# Patient Record
Sex: Male | Born: 1975 | Hispanic: No | Marital: Married | Smoking: Never smoker
Health system: Southern US, Community
[De-identification: ages and names within clinical notes are randomized; demographics above are authoritative.]

## PROBLEM LIST (undated history)

## (undated) DIAGNOSIS — Z6829 Body mass index (BMI) 29.0-29.9, adult: Secondary | ICD-10-CM

## (undated) DIAGNOSIS — E063 Autoimmune thyroiditis: Secondary | ICD-10-CM

## (undated) DIAGNOSIS — E663 Overweight: Secondary | ICD-10-CM

## (undated) DIAGNOSIS — R600 Localized edema: Secondary | ICD-10-CM

## (undated) DIAGNOSIS — J1281 Pneumonia due to SARS-associated coronavirus: Secondary | ICD-10-CM

## (undated) DIAGNOSIS — E039 Hypothyroidism, unspecified: Secondary | ICD-10-CM

## (undated) DIAGNOSIS — J45909 Unspecified asthma, uncomplicated: Secondary | ICD-10-CM

## (undated) DIAGNOSIS — I519 Heart disease, unspecified: Secondary | ICD-10-CM

## (undated) DIAGNOSIS — B9721 SARS-associated coronavirus as the cause of diseases classified elsewhere: Secondary | ICD-10-CM

## (undated) HISTORY — PX: SINOSCOPY: SHX187

## (undated) HISTORY — DX: Body mass index (BMI) 29.0-29.9, adult: Z68.29

## (undated) HISTORY — DX: Heart disease, unspecified: I51.9

## (undated) HISTORY — DX: Overweight: E66.3

## (undated) HISTORY — DX: Sars-associated coronavirus as the cause of diseases classified elsewhere: B97.21

## (undated) HISTORY — DX: Localized edema: R60.0

## (undated) HISTORY — DX: Hypothyroidism, unspecified: E03.9

## (undated) HISTORY — DX: Pneumonia due to SARS-associated coronavirus: J12.81

## (undated) HISTORY — DX: Autoimmune thyroiditis: E06.3

## (undated) HISTORY — DX: Unspecified asthma, uncomplicated: J45.909

---

## 2013-07-25 ENCOUNTER — Encounter: Payer: Self-pay | Admitting: Family Medicine

## 2016-07-23 ENCOUNTER — Ambulatory Visit: Payer: BLUE CROSS/BLUE SHIELD | Admitting: Neurology

## 2016-10-24 ENCOUNTER — Ambulatory Visit (HOSPITAL_COMMUNITY)
Admission: RE | Admit: 2016-10-24 | Discharge: 2016-10-24 | Disposition: A | Discharge status: Home / Self Care | Payer: BLUE CROSS/BLUE SHIELD | Source: Ambulatory Visit | Attending: Family Medicine | Admitting: Family Medicine

## 2016-10-24 ENCOUNTER — Other Ambulatory Visit (HOSPITAL_COMMUNITY): Payer: Self-pay | Admitting: Family Medicine

## 2016-10-24 DIAGNOSIS — R221 Localized swelling, mass and lump, neck: Secondary | ICD-10-CM

## 2016-10-24 LAB — TSH: TSH: 7.3 u[IU]/mL — AB (ref <=5.90)

## 2016-11-05 ENCOUNTER — Encounter: Payer: Self-pay | Admitting: Internal Medicine

## 2016-11-28 ENCOUNTER — Ambulatory Visit: Payer: BLUE CROSS/BLUE SHIELD | Admitting: Gastroenterology

## 2016-12-08 ENCOUNTER — Ambulatory Visit: Payer: BLUE CROSS/BLUE SHIELD | Admitting: "Endocrinology

## 2017-03-09 ENCOUNTER — Encounter: Payer: Self-pay | Admitting: "Endocrinology

## 2017-03-09 ENCOUNTER — Ambulatory Visit (INDEPENDENT_AMBULATORY_CARE_PROVIDER_SITE_OTHER): Payer: BLUE CROSS/BLUE SHIELD | Admitting: "Endocrinology

## 2017-03-09 ENCOUNTER — Other Ambulatory Visit: Payer: Self-pay | Admitting: "Endocrinology

## 2017-03-09 VITALS — BP 138/84 | HR 70 | Ht 69.0 in | Wt 209.0 lb

## 2017-03-09 DIAGNOSIS — E039 Hypothyroidism, unspecified: Secondary | ICD-10-CM

## 2017-03-09 NOTE — Progress Notes (Signed)
Subjective:    Patient ID: Eddie Ray, male    DOB: 07/16/1976, PCP Elfredia NevinsFusco, Lawrence, MD   Past Medical History:  Diagnosis Date  . Hypothyroidism    History reviewed. No pertinent surgical history. Social History   Social History  . Marital status: Married    Spouse name: N/A  . Number of children: N/A  . Years of education: N/A   Social History Main Topics  . Smoking status: Never Smoker  . Smokeless tobacco: Current User  . Alcohol use No  . Drug use: No  . Sexual activity: Not Asked   Other Topics Concern  . None   Social History Narrative  . None   Outpatient Encounter Prescriptions as of 03/09/2017  Medication Sig  . levothyroxine (SYNTHROID, LEVOTHROID) 25 MCG tablet Take 25 mcg by mouth daily before breakfast.   No facility-administered encounter medications on file as of 03/09/2017.    ALLERGIES: No Known Allergies  VACCINATION STATUS:  There is no immunization history on file for this patient.  HPI  41 year old gentleman with medical history as above. He is being seen in consultation for hypothyroidism requested by Dr. Sherwood GamblerFusco. -He was diagnosed with hypothyroidism in December 2017 when he was started on levothyroxine 25 g by mouth every morning. He reports compliance. He continues to gain weight, gaining 20+ pounds over the last year. He complains of fatigue, constipation. He has family history of hypothyroidism in one of his sisters. He denies any personal history of goiter, nor any family history of thyroid cancer. -He denies heat/cold intolerance.  Review of Systems  Constitutional: + weight gain, + fatigue, no subjective hyperthermia, no subjective hypothermia Eyes: no blurry vision, no xerophthalmia ENT: no sore throat, no nodules palpated in throat, no dysphagia/odynophagia, no hoarseness Cardiovascular: no Chest Pain, no Shortness of Breath, no palpitations, no leg swelling Respiratory: no cough, no SOB Gastrointestinal: no  Nausea/Vomiting/Diarhhea Musculoskeletal: no muscle/joint aches Skin: no rashes Neurological: no tremors, no numbness, no tingling, no dizziness Psychiatric: no depression, no anxiety  Objective:    BP 138/84   Pulse 70   Ht 5\' 9"  (1.753 m)   Wt 209 lb (94.8 kg)   BMI 30.86 kg/m   Wt Readings from Last 3 Encounters:  03/09/17 209 lb (94.8 kg)    Physical Exam  Constitutional:  Significantly over weight for hight, not in acute distress, normal state of mind Eyes: PERRLA, EOMI, no exophthalmos ENT: moist mucous membranes, no thyromegaly, no cervical lymphadenopathy Cardiovascular: normal precordial activity, Regular Rate and Rhythm, no Murmur/Rubs/Gallops Respiratory:  adequate breathing efforts, no gross chest deformity, Clear to auscultation bilaterally Gastrointestinal: abdomen obese, soft, Non -tender, No distension, Bowel Sounds present Musculoskeletal: no gross deformities, strength intact in all four extremities Skin: moist, warm, no rashes Neurological: no tremor with outstretched hands, Deep tendon reflexes normal in all four extremities.  10/24/2016: TSH 7.3.   Assessment & Plan:   1. Hypothyroidism, unspecified type - This patient is being seen in kind request of Dr. Sherwood GamblerFusco. I have reviewed his available records and clinically evaluated this patient. He seems to have hypothyroidism which would need lifelong thyroid hormone replacement- nightly with higher dose. - His last thyroid function tests are from December 2017, not recently enough to make a dose adjustment. I will send him to lab to get new set of thyroid function test and he will return in 1 week for reevaluation. In the meantime, I advised him to continue levothyroxine 25 V by mouth every morning. - -  We discussed about correct intake of levothyroxine, at fasting, with water, separated by at least 30 minutes from breakfast, and separated by more than 4 hours from calcium, iron, multivitamins, acid reflux  medications (PPIs). -Patient is made aware of the fact that thyroid hormone replacement is needed for life, dose to be adjusted by periodic monitoring of thyroid function tests.  - I advised patient to maintain close follow up with Elfredia Nevins, MD for primary care needs. Follow up plan: Return in about 1 week (around 03/16/2017) for labs today, follow up with pre-visit labs.  Marquis Lunch, MD Phone: [phone]  Fax: [phone]   03/09/2017, 2:01 PM

## 2017-03-10 LAB — THYROID PEROXIDASE ANTIBODY: Thyroperoxidase Ab SerPl-aCnc: 282 IU/mL — ABNORMAL HIGH (ref 0–34)

## 2017-03-10 LAB — THYROGLOBULIN ANTIBODY: THYROGLOBULIN ANTIBODY: 1.1 [IU]/mL — AB (ref 0.0–0.9)

## 2017-03-10 LAB — TSH: TSH: 19.74 u[IU]/mL — ABNORMAL HIGH (ref 0.450–4.500)

## 2017-03-10 LAB — T3: T3, Total: 129 ng/dL (ref 71–180)

## 2017-03-10 LAB — T4, FREE: Free T4: 1.14 ng/dL (ref 0.82–1.77)

## 2017-03-16 ENCOUNTER — Encounter: Payer: Self-pay | Admitting: "Endocrinology

## 2017-03-16 ENCOUNTER — Ambulatory Visit (INDEPENDENT_AMBULATORY_CARE_PROVIDER_SITE_OTHER): Payer: BLUE CROSS/BLUE SHIELD | Admitting: "Endocrinology

## 2017-03-16 VITALS — BP 133/88 | HR 80 | Ht 69.0 in | Wt 210.0 lb

## 2017-03-16 DIAGNOSIS — E038 Other specified hypothyroidism: Secondary | ICD-10-CM

## 2017-03-16 DIAGNOSIS — E063 Autoimmune thyroiditis: Secondary | ICD-10-CM | POA: Diagnosis not present

## 2017-03-16 HISTORY — DX: Other specified hypothyroidism: E03.8

## 2017-03-16 HISTORY — DX: Autoimmune thyroiditis: E06.3

## 2017-03-16 MED ORDER — LEVOTHYROXINE SODIUM 50 MCG PO TABS: 50.0000 ug | ORAL_TABLET | Freq: Every day | ORAL | 2 refills | Status: DC

## 2017-03-16 NOTE — Progress Notes (Signed)
Subjective:    Patient ID: Eddie Ray, male    DOB: 04/06/1976, PCP Elfredia NevinsFusco, Lawrence, MD   Past Medical History:  Diagnosis Date  . Hypothyroidism    No past surgical history on file. Social History   Social History  . Marital status: Married    Spouse name: N/A  . Number of children: N/A  . Years of education: N/A   Social History Main Topics  . Smoking status: Never Smoker  . Smokeless tobacco: Current User  . Alcohol use No  . Drug use: No  . Sexual activity: Not on file   Other Topics Concern  . Not on file   Social History Narrative  . No narrative on file   Outpatient Encounter Prescriptions as of 03/16/2017  Medication Sig  . levothyroxine (SYNTHROID, LEVOTHROID) 50 MCG tablet Take 1 tablet (50 mcg total) by mouth daily before breakfast.  . [DISCONTINUED] levothyroxine (SYNTHROID, LEVOTHROID) 25 MCG tablet Take 25 mcg by mouth daily before breakfast.   No facility-administered encounter medications on file as of 03/16/2017.    ALLERGIES: No Known Allergies  VACCINATION STATUS:  There is no immunization history on file for this patient.  HPI  41 year old gentleman with medical history as above. He is being seen in f/u for hypothyroidism requested by Dr. Sherwood GamblerFusco. -He was diagnosed with hypothyroidism in December 2017 when he was started on levothyroxine 25 g by mouth every morning. He reports compliance. He continues to gain weight, gaining 20+ pounds over the last year. He complains of fatigue, constipation. He has family history of hypothyroidism in one of his sisters. He denies any personal history of goiter, nor any family history of thyroid cancer. -He denies heat/cold intolerance.  Review of Systems  Constitutional: + weight gain, + fatigue, no subjective hyperthermia, no subjective hypothermia Eyes: no blurry vision, no xerophthalmia ENT: no sore throat, no nodules palpated in throat, no dysphagia/odynophagia, no hoarseness Cardiovascular: no  Chest Pain, no Shortness of Breath, no palpitations, no leg swelling Respiratory: no cough, no SOB Gastrointestinal: no Nausea/Vomiting/Diarhhea Musculoskeletal: no muscle/joint aches Skin: no rashes Neurological: no tremors, no numbness, no tingling, no dizziness Psychiatric: no depression, no anxiety  Objective:    BP 133/88   Pulse 80   Ht 5\' 9"  (1.753 m)   Wt 210 lb (95.3 kg)   BMI 31.01 kg/m   Wt Readings from Last 3 Encounters:  03/16/17 210 lb (95.3 kg)  03/09/17 209 lb (94.8 kg)    Physical Exam  Constitutional:  Significantly over weight for hight, not in acute distress, normal state of mind Eyes: PERRLA, EOMI, no exophthalmos ENT: moist mucous membranes, no thyromegaly, no cervical lymphadenopathy Cardiovascular: normal precordial activity, Regular Rate and Rhythm, no Murmur/Rubs/Gallops Respiratory:  adequate breathing efforts, no gross chest deformity, Clear to auscultation bilaterally Gastrointestinal: abdomen obese, soft, Non -tender, No distension, Bowel Sounds present Musculoskeletal: no gross deformities, strength intact in all four extremities Skin: moist, warm, no rashes Neurological: no tremor with outstretched hands, Deep tendon reflexes normal in all four extremities.  10/24/2016: TSH 7.3.   Assessment & Plan:   1. Hypothyroidism. 2. Hashimoto's thyroiditis. - Based on his recent thyroid function test forms of his hypothyroidism is Hashimoto's thyroiditis and he would benefit from higher dose of levothyroxine. - I will prescribe levothyroxine 50 mg by mouth every morning.  - We discussed about correct intake of levothyroxine, at fasting, with water, separated by at least 30 minutes from breakfast, and separated by more than 4  hours from calcium, iron, multivitamins, acid reflux medications (PPIs). -Patient is made aware of the fact that thyroid hormone replacement is needed for life, dose to be adjusted by periodic monitoring of thyroid function  tests.  - I advised patient to maintain close follow up with Elfredia Nevins, MD for primary care needs. Follow up plan: Return in about 8 weeks (around 05/11/2017) for follow up with pre-visit labs.  Marquis Lunch, MD Phone: [phone]  Fax: [phone]   03/16/2017, 3:41 PM

## 2017-05-25 ENCOUNTER — Ambulatory Visit: Payer: BLUE CROSS/BLUE SHIELD | Admitting: "Endocrinology

## 2017-05-25 ENCOUNTER — Encounter: Payer: Self-pay | Admitting: "Endocrinology

## 2017-07-02 ENCOUNTER — Other Ambulatory Visit: Payer: Self-pay | Admitting: "Endocrinology

## 2017-10-18 ENCOUNTER — Other Ambulatory Visit: Payer: Self-pay | Admitting: "Endocrinology

## 2019-08-05 ENCOUNTER — Emergency Department (HOSPITAL_COMMUNITY): Payer: BC Managed Care – PPO

## 2019-08-05 ENCOUNTER — Other Ambulatory Visit: Payer: Self-pay

## 2019-08-05 ENCOUNTER — Inpatient Hospital Stay (HOSPITAL_COMMUNITY)
Admission: EM | Admit: 2019-08-05 | Discharge: 2019-08-08 | DRG: 291 | Disposition: A | Discharge status: Home / Self Care | Payer: BC Managed Care – PPO | Attending: Internal Medicine | Admitting: Internal Medicine

## 2019-08-05 ENCOUNTER — Inpatient Hospital Stay (HOSPITAL_COMMUNITY): Payer: BC Managed Care – PPO

## 2019-08-05 ENCOUNTER — Encounter (HOSPITAL_COMMUNITY): Payer: Self-pay | Admitting: Internal Medicine

## 2019-08-05 DIAGNOSIS — R0602 Shortness of breath: Secondary | ICD-10-CM | POA: Diagnosis not present

## 2019-08-05 DIAGNOSIS — J9601 Acute respiratory failure with hypoxia: Secondary | ICD-10-CM | POA: Diagnosis present

## 2019-08-05 DIAGNOSIS — J810 Acute pulmonary edema: Secondary | ICD-10-CM | POA: Diagnosis not present

## 2019-08-05 DIAGNOSIS — E038 Other specified hypothyroidism: Secondary | ICD-10-CM | POA: Diagnosis present

## 2019-08-05 DIAGNOSIS — N390 Urinary tract infection, site not specified: Secondary | ICD-10-CM | POA: Diagnosis present

## 2019-08-05 DIAGNOSIS — I5031 Acute diastolic (congestive) heart failure: Principal | ICD-10-CM | POA: Diagnosis present

## 2019-08-05 DIAGNOSIS — Z79890 Hormone replacement therapy (postmenopausal): Secondary | ICD-10-CM | POA: Diagnosis not present

## 2019-08-05 DIAGNOSIS — Z20828 Contact with and (suspected) exposure to other viral communicable diseases: Secondary | ICD-10-CM | POA: Diagnosis present

## 2019-08-05 DIAGNOSIS — E063 Autoimmune thyroiditis: Secondary | ICD-10-CM | POA: Diagnosis present

## 2019-08-05 DIAGNOSIS — R112 Nausea with vomiting, unspecified: Secondary | ICD-10-CM | POA: Diagnosis present

## 2019-08-05 DIAGNOSIS — K209 Esophagitis, unspecified without bleeding: Secondary | ICD-10-CM | POA: Diagnosis present

## 2019-08-05 DIAGNOSIS — K760 Fatty (change of) liver, not elsewhere classified: Secondary | ICD-10-CM | POA: Diagnosis present

## 2019-08-05 DIAGNOSIS — R0902 Hypoxemia: Secondary | ICD-10-CM

## 2019-08-05 DIAGNOSIS — R000 Tachycardia, unspecified: Secondary | ICD-10-CM

## 2019-08-05 HISTORY — DX: Nausea with vomiting, unspecified: R11.2

## 2019-08-05 LAB — COMPREHENSIVE METABOLIC PANEL
ALT: 32 U/L (ref 0–44)
AST: 26 U/L (ref 15–41)
Albumin: 4 g/dL (ref 3.5–5.0)
Alkaline Phosphatase: 59 U/L (ref 38–126)
Anion gap: 14 (ref 5–15)
BUN: 20 mg/dL (ref 6–20)
CO2: 21 mmol/L — ABNORMAL LOW (ref 22–32)
Calcium: 8.6 mg/dL — ABNORMAL LOW (ref 8.9–10.3)
Chloride: 105 mmol/L (ref 98–111)
Creatinine, Ser: 1.3 mg/dL — ABNORMAL HIGH (ref 0.61–1.24)
GFR calc Af Amer: >60 mL/min (ref >60)
GFR calc non Af Amer: >60 mL/min (ref >60)
Glucose, Bld: 224 mg/dL — ABNORMAL HIGH (ref 70–99)
Potassium: 3.5 mmol/L (ref 3.5–5.1)
Sodium: 140 mmol/L (ref 135–145)
Total Bilirubin: 0.6 mg/dL (ref 0.3–1.2)
Total Protein: 6.4 g/dL — ABNORMAL LOW (ref 6.5–8.1)

## 2019-08-05 LAB — CBC WITH DIFFERENTIAL/PLATELET
Abs Immature Granulocytes: 0.07 10*3/uL (ref 0.00–0.07)
Basophils Absolute: 0 10*3/uL (ref 0.0–0.1)
Basophils Relative: 0 %
Eosinophils Absolute: 0.2 10*3/uL (ref 0.0–0.5)
Eosinophils Relative: 2 %
HCT: 44.3 % (ref 39.0–52.0)
Hemoglobin: 14.5 g/dL (ref 13.0–17.0)
Immature Granulocytes: 1 %
Lymphocytes Relative: 21 %
Lymphs Abs: 2.3 10*3/uL (ref 0.7–4.0)
MCH: 32 pg (ref 26.0–34.0)
MCHC: 32.7 g/dL (ref 30.0–36.0)
MCV: 97.8 fL (ref 80.0–100.0)
Monocytes Absolute: 0.3 10*3/uL (ref 0.1–1.0)
Monocytes Relative: 2 %
Neutro Abs: 8.3 10*3/uL — ABNORMAL HIGH (ref 1.7–7.7)
Neutrophils Relative %: 74 %
Platelets: 452 10*3/uL — ABNORMAL HIGH (ref 150–400)
RBC: 4.53 MIL/uL (ref 4.22–5.81)
RDW: 12.3 % (ref 11.5–15.5)
WBC: 11.1 10*3/uL — ABNORMAL HIGH (ref 4.0–10.5)
nRBC: 0 % (ref 0.0–0.2)

## 2019-08-05 LAB — TSH: TSH: 10.279 u[IU]/mL — ABNORMAL HIGH (ref 0.350–4.500)

## 2019-08-05 LAB — C-REACTIVE PROTEIN: CRP: <0.8 mg/dL (ref <1.0)

## 2019-08-05 LAB — URINALYSIS, MICROSCOPIC (REFLEX): RBC / HPF: NONE SEEN RBC/hpf (ref 0–5)

## 2019-08-05 LAB — RESPIRATORY PANEL BY PCR

## 2019-08-05 LAB — URINALYSIS, ROUTINE W REFLEX MICROSCOPIC
Bilirubin Urine: NEGATIVE
Glucose, UA: NEGATIVE mg/dL
Hgb urine dipstick: NEGATIVE
Ketones, ur: 15 mg/dL — AB
Leukocytes,Ua: NEGATIVE
Nitrite: NEGATIVE
Protein, ur: 30 mg/dL — AB
Specific Gravity, Urine: 1.01 (ref 1.005–1.030)
pH: 5 (ref 5.0–8.0)

## 2019-08-05 LAB — TRIGLYCERIDES: Triglycerides: 103 mg/dL (ref <150)

## 2019-08-05 LAB — BRAIN NATRIURETIC PEPTIDE: B Natriuretic Peptide: 17.1 pg/mL (ref 0.0–100.0)

## 2019-08-05 LAB — LACTIC ACID, PLASMA
Lactic Acid, Venous: 1.8 mmol/L (ref 0.5–1.9)
Lactic Acid, Venous: 2.7 mmol/L (ref 0.5–1.9)
Lactic Acid, Venous: 1 mmol/L (ref 0.5–1.9)
Lactic Acid, Venous: 1.3 mmol/L (ref 0.5–1.9)

## 2019-08-05 LAB — LACTATE DEHYDROGENASE: LDH: 218 U/L — ABNORMAL HIGH (ref 98–192)

## 2019-08-05 LAB — ECHOCARDIOGRAM COMPLETE

## 2019-08-05 LAB — D-DIMER, QUANTITATIVE: D-Dimer, Quant: 3.97 ug/mL-FEU — ABNORMAL HIGH (ref 0.00–0.50)

## 2019-08-05 LAB — INFLUENZA PANEL BY PCR (TYPE A & B)
Influenza A By PCR: NEGATIVE
Influenza B By PCR: NEGATIVE

## 2019-08-05 LAB — SARS CORONAVIRUS 2 BY RT PCR (HOSPITAL ORDER, PERFORMED IN ~~LOC~~ HOSPITAL LAB): SARS Coronavirus 2: NEGATIVE

## 2019-08-05 LAB — FIBRINOGEN: Fibrinogen: 304 mg/dL (ref 210–475)

## 2019-08-05 LAB — FERRITIN: Ferritin: 37 ng/mL (ref 24–336)

## 2019-08-05 LAB — PROCALCITONIN: Procalcitonin: <0.1 ng/mL

## 2019-08-05 LAB — TROPONIN I (HIGH SENSITIVITY): Troponin I (High Sensitivity): 21 ng/L — ABNORMAL HIGH (ref <18)

## 2019-08-05 LAB — HIV ANTIBODY (ROUTINE TESTING W REFLEX): HIV Screen 4th Generation wRfx: NONREACTIVE

## 2019-08-05 MED ORDER — DOCUSATE SODIUM 100 MG PO CAPS
100.0000 mg | ORAL_CAPSULE | Freq: Two times a day (BID) | ORAL | Status: DC
  Administered 2019-08-06 – 2019-08-08 (×5): 100 mg via ORAL
  Filled 2019-08-05 (×7): qty 1

## 2019-08-05 MED ORDER — LEVOTHYROXINE SODIUM 50 MCG PO TABS
50.0000 ug | ORAL_TABLET | Freq: Every day | ORAL | Status: DC
  Administered 2019-08-05 – 2019-08-08 (×4): 50 ug via ORAL
  Filled 2019-08-05 (×5): qty 1

## 2019-08-05 MED ORDER — ACETAMINOPHEN 325 MG PO TABS: 650.0000 mg | ORAL_TABLET | Freq: Four times a day (QID) | ORAL | Status: DC | PRN

## 2019-08-05 MED ORDER — SODIUM CHLORIDE 0.9% FLUSH
3.0000 mL | Freq: Two times a day (BID) | INTRAVENOUS | Status: DC
  Administered 2019-08-05 – 2019-08-08 (×2): 3 mL via INTRAVENOUS

## 2019-08-05 MED ORDER — SODIUM CHLORIDE 0.9 % IV SOLN: 250.0000 mL | INTRAVENOUS | Status: DC | PRN

## 2019-08-05 MED ORDER — ONDANSETRON HCL 4 MG/2ML IJ SOLN
4.0000 mg | Freq: Four times a day (QID) | INTRAMUSCULAR | Status: DC | PRN
  Filled 2019-08-05: qty 2

## 2019-08-05 MED ORDER — ENOXAPARIN SODIUM 40 MG/0.4ML ~~LOC~~ SOLN
40.0000 mg | SUBCUTANEOUS | Status: DC
  Administered 2019-08-05 – 2019-08-07 (×3): 40 mg via SUBCUTANEOUS
  Filled 2019-08-05 (×3): qty 0.4

## 2019-08-05 MED ORDER — SODIUM CHLORIDE 0.9 % IV SOLN
INTRAVENOUS | Status: DC
  Administered 2019-08-05 – 2019-08-06 (×2): via INTRAVENOUS

## 2019-08-05 MED ORDER — SODIUM CHLORIDE 0.9% FLUSH
3.0000 mL | Freq: Two times a day (BID) | INTRAVENOUS | Status: DC
  Administered 2019-08-05 – 2019-08-08 (×7): 3 mL via INTRAVENOUS

## 2019-08-05 MED ORDER — OXYCODONE HCL 5 MG PO TABS: 5.0000 mg | ORAL_TABLET | ORAL | Status: DC | PRN

## 2019-08-05 MED ORDER — BISACODYL 5 MG PO TBEC: 5.0000 mg | DELAYED_RELEASE_TABLET | Freq: Every day | ORAL | Status: DC | PRN

## 2019-08-05 MED ORDER — IOHEXOL 350 MG/ML SOLN
100.0000 mL | Freq: Once | INTRAVENOUS | Status: AC | PRN
  Administered 2019-08-05: 100 mL via INTRAVENOUS

## 2019-08-05 MED ORDER — PROPRANOLOL HCL 60 MG PO TABS
60.0000 mg | ORAL_TABLET | Freq: Once | ORAL | Status: AC
  Administered 2019-08-05: 60 mg via ORAL
  Filled 2019-08-05: qty 1

## 2019-08-05 MED ORDER — SODIUM CHLORIDE 0.9 % IV SOLN
1.0000 g | Freq: Once | INTRAVENOUS | Status: AC
  Administered 2019-08-05: 1 g via INTRAVENOUS
  Filled 2019-08-05: qty 10

## 2019-08-05 MED ORDER — SODIUM CHLORIDE 0.9% FLUSH: 3.0000 mL | INTRAVENOUS | Status: DC | PRN

## 2019-08-05 MED ORDER — POLYETHYLENE GLYCOL 3350 17 G PO PACK: 17.0000 g | PACK | Freq: Every day | ORAL | Status: DC | PRN

## 2019-08-05 MED ORDER — ONDANSETRON HCL 4 MG PO TABS: 4.0000 mg | ORAL_TABLET | Freq: Four times a day (QID) | ORAL | Status: DC | PRN

## 2019-08-05 MED ORDER — SODIUM CHLORIDE 0.9 % IV BOLUS
1000.0000 mL | Freq: Once | INTRAVENOUS | Status: AC
  Administered 2019-08-05: 1000 mL via INTRAVENOUS

## 2019-08-05 MED ORDER — PANTOPRAZOLE SODIUM 40 MG IV SOLR
40.0000 mg | Freq: Two times a day (BID) | INTRAVENOUS | Status: DC
  Administered 2019-08-05 – 2019-08-06 (×2): 40 mg via INTRAVENOUS
  Filled 2019-08-05 (×2): qty 40

## 2019-08-05 MED ORDER — SALINE SPRAY 0.65 % NA SOLN
1.0000 | NASAL | Status: DC | PRN
  Filled 2019-08-05: qty 44

## 2019-08-05 NOTE — ED Triage Notes (Signed)
Pt arrives to ED with c/o SOB, cough, and abdominal pain. Denies fever or chest pain.

## 2019-08-05 NOTE — ED Notes (Signed)
Pt placed on 2lpm o2 by n/c due to low o2 stats

## 2019-08-05 NOTE — ED Provider Notes (Signed)
**Note Eddie-Identified via Obfuscation** [facility] Provider Note  CSN: [id number] Arrival date & time: 08/05/19 0124  Chief Complaint(s) Shortness of Breath  HPI Deforest HoylesSuvaskumar Bagsby is a 43 y.o. male with a history of Hashimoto's thyroiditis resulting in hypothyroidism presents to the emergency department with sudden onset shortness of breath that began several hours prior to arrival.  Patient denies any associated chest pain but reports abdominal discomfort earlier today with several bouts of diarrhea.  Reports 2 to 3-week history of dry cough.  No fevers or chills.  No myalgia.  No nausea or vomiting.  Denies any prior history of DVTs.  Does report that a family member had COVID, but this was 6 months ago.  No other known COVID exposures or sick contacts.  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    HPI  Past Medical History Past Medical History:  Diagnosis Date  . Hypothyroidism    Patient Active Problem List   Diagnosis Date Noted  . Hypothyroidism due to Hashimoto's thyroiditis 03/16/2017   Home Medication(s) Prior to Admission medications   Medication Sig Start Date End Date Taking? Authorizing Provider  levothyroxine (SYNTHROID, LEVOTHROID) 50 MCG tablet take 1 tablet by mouth every morning ON AN EMPTY STOMACH 10/21/17   Eddie Ray, Gebreselassie W, MD                                                                                                                                    Past Surgical History No past surgical history on file. Family History Family History  Problem Relation Age of Onset  . Diabetes Father   . Diabetes Sister     Social History Social History   Tobacco Use  . Smoking status: Never Smoker  . Smokeless tobacco: Current User  Substance Use Topics  . Alcohol use: No  . Drug use: No   Allergies Patient has no known allergies.  Review of Systems Review of Systems All other systems are reviewed and are negative for acute change except as noted in the HPI   Physical Exam Vital Signs  I have reviewed the triage vital signs BP 113/76   Pulse (!) 116   Temp 100 F (37.8 C) (Rectal)   Resp 16   SpO2 98%   Physical Exam Vitals signs reviewed.  Constitutional:      General: He is not in acute distress.    Appearance: He is well-developed. He is not diaphoretic.  HENT:     Head: Normocephalic and atraumatic.     Nose: Nose normal.  Eyes:     General: No scleral icterus.       Right eye: No discharge.        Left eye: No discharge.     Conjunctiva/sclera:     Right eye: Right conjunctiva is injected.     Left eye: Left conjunctiva is injected.     Pupils: Pupils are equal, round, and reactive  to light.  Neck:     Musculoskeletal: Normal range of motion and neck supple.  Cardiovascular:     Rate and Rhythm: Regular rhythm. Tachycardia present.     Heart sounds: No murmur. No friction rub. No gallop.   Pulmonary:     Effort: Pulmonary effort is normal. No respiratory distress.     Breath sounds: Normal breath sounds. No stridor. No wheezing, rhonchi or rales.  Abdominal:     General: There is no distension.     Palpations: Abdomen is soft.     Tenderness: There is no abdominal tenderness.  Musculoskeletal:        General: No tenderness.  Skin:    General: Skin is warm and dry.     Findings: No erythema or rash.  Neurological:     Mental Status: He is alert and oriented to person, place, and time.     ED Results and Treatments Labs (all labs ordered are listed, but only abnormal results are displayed) Labs Reviewed  LACTIC ACID, PLASMA - Abnormal; Notable for the following components:      Result Value   Lactic Acid, Venous 2.7 (*)    All other components within normal limits  CBC WITH DIFFERENTIAL/PLATELET - Abnormal; Notable for the following components:   WBC 11.1 (*)    Platelets 452 (*)    Neutro Abs 8.3 (*)    All other components within normal limits  COMPREHENSIVE METABOLIC PANEL - Abnormal; Notable for the  following components:   CO2 21 (*)    Glucose, Bld 224 (*)    Creatinine, Ser 1.30 (*)    Calcium 8.6 (*)    Total Protein 6.4 (*)    All other components within normal limits  D-DIMER, QUANTITATIVE (NOT AT [facility]) - Abnormal; Notable for the following components:   D-Dimer, Quant 3.97 (*)    All other components within normal limits  LACTATE DEHYDROGENASE - Abnormal; Notable for the following components:   LDH 218 (*)    All other components within normal limits  SARS CORONAVIRUS 2 BY RT PCR (HOSPITAL ORDER, [facility])  CULTURE, BLOOD (ROUTINE X 2)  CULTURE, BLOOD (ROUTINE X 2)  LACTIC ACID, PLASMA  PROCALCITONIN  FERRITIN  TRIGLYCERIDES  FIBRINOGEN  C-REACTIVE PROTEIN  BRAIN NATRIURETIC PEPTIDE  TSH  T4  URINALYSIS, ROUTINE W REFLEX MICROSCOPIC  TROPONIN I (HIGH SENSITIVITY)                                                                                                                         EKG  EKG Interpretation  Date/Time:  Friday August 05 2019 01:32:58 EDT Ventricular Rate:  129 PR Interval:  124 QRS Duration: 62 QT Interval:  320 QTC Calculation: 468 R Axis:   2 Text Interpretation:  Sinus tachycardia Nonspecific ST abnormality Abnormal ECG [id number] Confirmed by Addison Lank [phone]) on 08/05/2019 1:40:17 AM      Radiology Ct Angio Chest Pe W And/or  Wo Contrast  Result Date: 08/05/2019 CLINICAL DATA:  Shortness of breath and cough and abdominal pain. Pulmonary embolism suspected EXAM: CT ANGIOGRAPHY CHEST WITH CONTRAST TECHNIQUE: Multidetector CT imaging of the chest was performed using the standard protocol during bolus administration of intravenous contrast. Multiplanar CT image reconstructions and MIPs were obtained to evaluate the vascular anatomy. CONTRAST:  OMNIPAQUE IOHEXOL 350 MG/ML SOLN COMPARISON:  None. FINDINGS: Cardiovascular: Normal heart size. No pericardial effusion. No pulmonary artery filling defect. Negative  aorta Mediastinum/Nodes: Diffuse circumferential wall thickening of the esophagus with surrounding fat haziness. No adenopathy. Lungs/Pleura: Generalized bronchial wall and interlobular septal thickening. No consolidation, effusion, or pneumothorax Upper Abdomen: Distended stomach. Hepatic steatosis with pericholecystic sparing. Musculoskeletal: No acute finding. Bridging midthoracic osteophytes. Review of the MIP images confirms the above findings. IMPRESSION: 1. Findings of esophagitis. 2. Pulmonary interstitial edema. 3. Negative for pulmonary embolism or cardiomegaly. Electronically Signed   By: Marnee Spring M.D.   On: 08/05/2019 05:41   Dg Chest Port 1 View  Result Date: 08/05/2019 CLINICAL DATA:  Hypoxia EXAM: PORTABLE CHEST 1 VIEW COMPARISON:  None. FINDINGS: The heart size and mediastinal contours are within normal limits. Both lungs are clear. The visualized skeletal structures are unremarkable. IMPRESSION: No acute cardiopulmonary process. Electronically Signed   By: Jonna Clark M.D.   On: 08/05/2019 02:37    Pertinent labs & imaging results that were available during my care of the patient were reviewed by me and considered in my medical decision making (see chart for details).  Medications Ordered in ED Medications  propranolol (INDERAL) tablet 60 mg (has no administration in time range)  cefTRIAXone (ROCEPHIN) 1 g in sodium chloride 0.9 % 100 mL IVPB (1 g Intravenous New Bag/Given 08/05/19 0650)  iohexol (OMNIPAQUE) 350 MG/ML injection 100 mL (100 mLs Intravenous Contrast Given 08/05/19 0446)  sodium chloride 0.9 % bolus 1,000 mL (1,000 mLs Intravenous New Bag/Given 08/05/19 0557)                                                                                                                                    Procedures .Critical Care Performed by: Nira Conn, MD Authorized by: Nira Conn, MD    CRITICAL CARE Performed by: Amadeo Garnet Cardama Total  critical care time: 60 minutes Critical care time was exclusive of separately billable procedures and treating other patients. Critical care was necessary to treat or prevent imminent or life-threatening deterioration. Critical care was time spent personally by me on the following activities: development of treatment plan with patient and/or surrogate as well as nursing, discussions with consultants, evaluation of patient's response to treatment, examination of patient, obtaining history from patient or surrogate, ordering and performing treatments and interventions, ordering and review of laboratory studies, ordering and review of radiographic studies, pulse oximetry and re-evaluation of patient's condition.    (including critical care time)  Medical Decision Making / ED Course I  have reviewed the nursing notes for this encounter and the patient's prior records (if available in EHR or on provided paperwork).   Kenji Mapel was evaluated in Emergency Department on 08/05/2019 for the symptoms described in the history of present illness. He was evaluated in the context of the global COVID-19 pandemic, which necessitated consideration that the patient might be at risk for infection with the SARS-CoV-2 virus that causes COVID-19. Institutional protocols and algorithms that pertain to the evaluation of patients at risk for COVID-19 are in a state of rapid change based on information released by regulatory bodies including the CDC and federal and state organizations. These policies and algorithms were followed during the patient's care in the ED.  Patient is tachycardic and hypoxic on room air with sats in the 80s.  Given patient's history of Hashimoto's thyroiditis with sudden onset shortness of breath and hypoxia, PE is high on the differential.  Patient is afebrile, but reports cough and has conjunctival injection.  Also suspecting viral infection including COVID.  EKG with sinus tachycardia  including S1Q3T3.  Chest x-ray without evidence of pneumonia, pneumothorax, pulmonary edema.   Dimer and LDH elevated. Rapid COVID negative. CTA negative for PE, but showed pulmonary edema.  Uncertain of etiology. Viral process still likely. Will need to rule out bacteremia, Give rocephin. This maybe unlikely given normal procaltonin, but need to follow cultures.   Thyroid storm would be unlikely. TSH/T4 ordered. Given lack of other confirmed and persistent tachycardia, he was given single dose of propranolol.  Will need admission for further work up and management.     Final Clinical Impression(s) / ED Diagnoses Final diagnoses:  Hypoxia  Acute pulmonary edema (HCC)  Tachycardia      This chart was dictated using voice recognition software.  Despite best efforts to proofread,  errors can occur which can change the documentation meaning.   Nira Conn, MD 08/05/19 [phone]

## 2019-08-05 NOTE — H&P (Addendum)
History and Physical    Eddie Ray ZSW:[id number] DOB: [birth year] DOA: 08/05/2019  PCP: Redmond School, MD Consultants:  Dorris Fetch - endocrinology Patient coming from:  Home - lives with wife and children; NOK: Wife, Chriss Driver, [phone]  Chief Complaint: SOB  HPI: Eddie Ray is a 43 y.o. male with medical history significant of Hashimoto's thyroiditis with subsequent hypothyroidism presenting with SOB.   He noticed breathing problems about 1230 last night.  He has had a cough and allergy problems for the last 2-3 weeks.  Denies fever.  He was given medicine, "the heart medicine."  No orthopnea.  +hand and pedal edema.    His wife reports he has been sick for more than a week now.  He has been dribbling his urine stream.  He has been complaining of severe abdominal pain, worse after eating.  Last night he complained of SOB and so she brought him to the hospital.  +dysuria and itching with urination.  His wife was afraid he might have COVID and so did not monitor his temperature.  +abdominal distention.   ED Course: Sudden SOB, hypoxia, tachycardia.  Conjunctivitis, 2-3 weeks dry cough, diarrhea yesterday.  Negative for PE, D-dimer, LDH elevated.  COVID negative.  ?source.  TSH pending, given Propranolol x1.  Given Rocephin empirically.  Review of Systems: As per HPI; otherwise review of systems reviewed and negative.   Ambulatory Status:  Ambulates without assistance  Past Medical History:  Diagnosis Date  . Hypothyroidism     History reviewed. No pertinent surgical history.  Social History   Socioeconomic History  . Marital status: Married    Spouse name: Not on file  . Number of children: Not on file  . Years of education: Not on file  . Highest education level: Not on file  Occupational History  . Occupation: gas station  attendant  Social Needs  . Financial resource strain: Not on file  . Food insecurity    Worry: Not on file    Inability: Not on file  .  Transportation needs    Medical: Not on file    Non-medical: Not on file  Tobacco Use  . Smoking status: Never Smoker  . Smokeless tobacco: Current User  Substance and Sexual Activity  . Alcohol use: Yes  . Drug use: No  . Sexual activity: Not on file  Lifestyle  . Physical activity    Days per week: Not on file    Minutes per session: Not on file  . Stress: Not on file  Relationships  . Social Herbalist on phone: Not on file    Gets together: Not on file    Attends religious service: Not on file    Active member of club or organization: Not on file    Attends meetings of clubs or organizations: Not on file    Relationship status: Not on file  . Intimate partner violence    Fear of current or ex partner: Not on file    Emotionally abused: Not on file    Physically abused: Not on file    Forced sexual activity: Not on file  Other Topics Concern  . Not on file  Social History Narrative  . Not on file    No Known Allergies  Family History  Problem Relation Age of Onset  . Diabetes Father   . Diabetes Sister     Prior to Admission medications   Medication Sig Start Date End Date Taking? Authorizing Provider  levothyroxine (SYNTHROID, LEVOTHROID) 50 MCG tablet take 1 tablet by mouth every morning ON AN EMPTY STOMACH 10/21/17   Roma Kayser, MD    Physical Exam: Vitals:   08/05/19 0830 08/05/19 0900 08/05/19 1200 08/05/19 1242  BP: (!) 116/93 115/84 (!) 134/97 (!) 142/92  Pulse: (!) 112 (!) 105 97 (!) 108  Resp: 15 14 16  (!) 23  Temp:      TempSrc:      SpO2: 95% 97% 96% 100%     . General:  Appears calm and comfortable and is NAD . Eyes:  PERRL, EOMI, normal lids, iris . ENT:  grossly normal hearing, lips & tongue, mmm; appropriate dentition . Neck:  no LAD, masses or thyromegaly; no carotid bruits . Cardiovascular:  RRR, no m/r/g. No LE edema.  Respiratory:   CTA bilaterally with no wheezes/rales/rhonchi.  Normal respiratory effort. .  Abdomen:  soft, NT, mildly distended . Back:   normal alignment, no CVAT . Skin:  no rash or induration seen on limited exam . Musculoskeletal:  grossly normal tone BUE/BLE, good ROM, no bony abnormality . Lower extremity:  No LE edema.  Limited foot exam with no ulcerations.  2+ distal pulses. Marland Kitchen Psychiatric:  grossly normal mood and affect, speech fluent and appropriate, AOx3 . Neurologic:  CN 2-12 grossly intact, moves all extremities in coordinated fashion, sensation intact    Radiological Exams on Admission: Ct Angio Chest Pe W And/or Wo Contrast  Result Date: 08/05/2019 CLINICAL DATA:  Shortness of breath and cough and abdominal pain. Pulmonary embolism suspected EXAM: CT ANGIOGRAPHY CHEST WITH CONTRAST TECHNIQUE: Multidetector CT imaging of the chest was performed using the standard protocol during bolus administration of intravenous contrast. Multiplanar CT image reconstructions and MIPs were obtained to evaluate the vascular anatomy. CONTRAST:  10/05/2019 OMNIPAQUE IOHEXOL 350 MG/ML SOLN COMPARISON:  None. FINDINGS: Cardiovascular: Normal heart size. No pericardial effusion. No pulmonary artery filling defect. Negative aorta Mediastinum/Nodes: Diffuse circumferential wall thickening of the esophagus with surrounding fat haziness. No adenopathy. Lungs/Pleura: Generalized bronchial wall and interlobular septal thickening. No consolidation, effusion, or pneumothorax Upper Abdomen: Distended stomach. Hepatic steatosis with pericholecystic sparing. Musculoskeletal: No acute finding. Bridging midthoracic osteophytes. Review of the MIP images confirms the above findings. IMPRESSION: 1. Findings of esophagitis. 2. Pulmonary interstitial edema. 3. Negative for pulmonary embolism or cardiomegaly. Electronically Signed   By: M.D.   On: 08/05/2019 05:41   Dg Chest Port 1 View  Result Date: 08/05/2019 CLINICAL DATA:  Hypoxia EXAM: PORTABLE CHEST 1 VIEW COMPARISON:  None. FINDINGS: The heart  size and mediastinal contours are within normal limits. Both lungs are clear. The visualized skeletal structures are unremarkable. IMPRESSION: No acute cardiopulmonary process. Electronically Signed   By: 10/05/2019 M.D.   On: 08/05/2019 02:37    EKG: Independently reviewed.  Sinus tachycardia with rate 129; nonspecific ST changes with no evidence of acute ischemia   Labs on Admission: I have personally reviewed the available labs and imaging studies at the time of the admission.  Pertinent labs:   CO2 21 Glucose 224 BUN 20/Creatinine 1.30/GFR >60 LDH 218 Ferritin 37 CRP <0.8 Lactate 1.8, 2.7 Procalcitonin <0.10 WBC 11.1 Platelets 452 D-dimer 3.97 Fibrinogen 304 Blood cultures pending COVID negative TSH 10.279 HS troponin 21 HIV negative Influenza A/B negative Respiratory panel pending   Assessment/Plan Principal Problem:   Acute respiratory failure with hypoxia (HCC) Active Problems:   Hypothyroidism due to Hashimoto's thyroiditis   Nausea & vomiting  Acute respiratory failure with hypoxia and n/v -This patient is a generally healthy patient presenting with roughly a week of cough, n/v, abdominal pain, and urinary "dribble" with dysuria -He developed acute onset of SOB overnight and was 86% on RA upon arrival -UA and abdominal CT are pending -CTA chest was negative for PE but appeared to show esophagitis and pulmonary edema -Given his urinary symptoms and GI symptoms, these remain a consideration for source of illness -SIRS criteria in this patient includes: Leukocytosis, tachycardia, tachypnea, hypoxia  -Patient has evidence of acute organ failure with elevated lactate -While awaiting blood cultures, this appears to be a preseptic condition. -Sepsis protocol initiated -Blood and urine cultures pending -Will admit with telemetry and continue to monitor -Will add HIV -Will trend lactate to ensure improvement -The situation is confounded by a normal prolactin  level.  Antibiotics would not generally be indicated for PCT <0.1.  As such, he was given one dose of empiric Rocephin in the ER but holding additional antibiotics due to uncertainty of diagnosis. -Additionally, with respiratory symptoms and GI symptoms in the setting of the COVID-19 pandemic, COVID is a serious consideration; he did test negative but unless his urine or abdominal imaging indicates a cause then he needs to remain a PUI and have repeat testing tomorrow AM. -Pertinent labs concerning for COVID include increased BUN/Creatinine; low procalcitonin; markedly elevated D-dimer (>1); increased LDH -Will check daily labs including BMP with Mag, Phos; LFTs; CBC with differential; CRP; ferritin; fibrinogen; D-dimer for now -Will order STAT Echo given pulm edema on CTA, but normal BNP  -With D-dimer <5, will use standard-dosed Lovenox for DVT prevention -Patient was seen wearing full PPE including: gown, gloves, head cover, N95, and face shield; donning and doffing was in compliance with current standards.  Hypothyroidism -It appears that he has not been taking Synthroid, and his TSH is markedly elevated -However, it seems unlikely that hypothyroidism alone is the source of his issues -Will resume Synthroid and follow   DVT prophylaxis:  Lovenox Code Status:  Full - confirmed with patient Family Communication: None present; I spoke with his wife by telephone Disposition Plan:  Home once clinically improved Consults called: None  Admission status: Admit - It is my clinical opinion that admission to INPATIENT is reasonable and necessary because of the expectation that this patient will require hospital care that crosses at least 2 midnights to treat this condition based on the medical complexity of the problems presented.  Given the aforementioned information, the predictability of an adverse outcome is felt to be significant.   Jonah BlueJennifer Shariah Assad MD [facility]   How to contact the  [facility] Attending or Consulting provider 7A - 7P or covering provider during after hours 7P -7A, for this patient?  1. Check the care team in [facility] and look for a) attending/consulting [facility] provider listed and b) the [facility] team listed 2. Log into [email or web address] and use [facility]'s universal password to access. If you do not have the password, please contact the hospital operator. 3. Locate the [facility] provider you are looking for under [facility] and page to a number that you can be directly reached. 4. If you still have difficulty reaching the provider, please page the [facility] (Director on Call) for the Hospitalists listed on amion for assistance.   08/05/2019, 2:05 PM

## 2019-08-05 NOTE — Progress Notes (Signed)
  Echocardiogram 2D Echocardiogram has been performed.  Jaclyn Carew G Eddie Ray 08/05/2019, 10:57 AM

## 2019-08-06 LAB — CBC WITH DIFFERENTIAL/PLATELET
Abs Immature Granulocytes: 0.03 10*3/uL (ref 0.00–0.07)
Basophils Absolute: 0 10*3/uL (ref 0.0–0.1)
Basophils Relative: 0 %
Eosinophils Absolute: 0.4 10*3/uL (ref 0.0–0.5)
Eosinophils Relative: 6 %
HCT: 36.7 % — ABNORMAL LOW (ref 39.0–52.0)
Hemoglobin: 12.4 g/dL — ABNORMAL LOW (ref 13.0–17.0)
Immature Granulocytes: 1 %
Lymphocytes Relative: 36 %
Lymphs Abs: 2.4 10*3/uL (ref 0.7–4.0)
MCH: 32.5 pg (ref 26.0–34.0)
MCHC: 33.8 g/dL (ref 30.0–36.0)
MCV: 96.3 fL (ref 80.0–100.0)
Monocytes Absolute: 0.2 10*3/uL (ref 0.1–1.0)
Monocytes Relative: 4 %
Neutro Abs: 3.6 10*3/uL (ref 1.7–7.7)
Neutrophils Relative %: 53 %
Platelets: 368 10*3/uL (ref 150–400)
RBC: 3.81 MIL/uL — ABNORMAL LOW (ref 4.22–5.81)
RDW: 12.4 % (ref 11.5–15.5)
WBC: 6.6 10*3/uL (ref 4.0–10.5)
nRBC: 0 % (ref 0.0–0.2)

## 2019-08-06 LAB — COMPREHENSIVE METABOLIC PANEL
ALT: 22 U/L (ref 0–44)
AST: 21 U/L (ref 15–41)
Albumin: 3.5 g/dL (ref 3.5–5.0)
Alkaline Phosphatase: 39 U/L (ref 38–126)
Anion gap: 12 (ref 5–15)
BUN: 26 mg/dL — ABNORMAL HIGH (ref 6–20)
CO2: 22 mmol/L (ref 22–32)
Calcium: 8.1 mg/dL — ABNORMAL LOW (ref 8.9–10.3)
Chloride: 103 mmol/L (ref 98–111)
Creatinine, Ser: 1.23 mg/dL (ref 0.61–1.24)
GFR calc Af Amer: >60 mL/min (ref >60)
GFR calc non Af Amer: >60 mL/min (ref >60)
Glucose, Bld: 123 mg/dL — ABNORMAL HIGH (ref 70–99)
Potassium: 3.8 mmol/L (ref 3.5–5.1)
Sodium: 137 mmol/L (ref 135–145)
Total Bilirubin: 0.7 mg/dL (ref 0.3–1.2)
Total Protein: 5.9 g/dL — ABNORMAL LOW (ref 6.5–8.1)

## 2019-08-06 LAB — FERRITIN: Ferritin: 61 ng/mL (ref 24–336)

## 2019-08-06 LAB — SARS CORONAVIRUS 2 BY RT PCR (HOSPITAL ORDER, PERFORMED IN ~~LOC~~ HOSPITAL LAB): SARS Coronavirus 2: NEGATIVE

## 2019-08-06 LAB — URINE CULTURE: Culture: <10000 — AB

## 2019-08-06 LAB — T4: T4, Total: 6.5 ug/dL (ref 4.5–12.0)

## 2019-08-06 LAB — C-REACTIVE PROTEIN: CRP: 8.9 mg/dL — ABNORMAL HIGH (ref <1.0)

## 2019-08-06 LAB — D-DIMER, QUANTITATIVE: D-Dimer, Quant: 4.42 ug/mL-FEU — ABNORMAL HIGH (ref 0.00–0.50)

## 2019-08-06 LAB — MAGNESIUM: Magnesium: 1.9 mg/dL (ref 1.7–2.4)

## 2019-08-06 LAB — PHOSPHORUS: Phosphorus: 3 mg/dL (ref 2.5–4.6)

## 2019-08-06 MED ORDER — PANTOPRAZOLE SODIUM 40 MG PO TBEC
40.0000 mg | DELAYED_RELEASE_TABLET | Freq: Two times a day (BID) | ORAL | Status: DC
  Administered 2019-08-06 – 2019-08-08 (×5): 40 mg via ORAL
  Filled 2019-08-06 (×5): qty 1

## 2019-08-06 MED ORDER — BENZONATATE 100 MG PO CAPS
200.0000 mg | ORAL_CAPSULE | Freq: Once | ORAL | Status: AC
  Administered 2019-08-06: 200 mg via ORAL
  Filled 2019-08-06: qty 2

## 2019-08-06 NOTE — Progress Notes (Signed)
PROGRESS NOTE    Eddie Ray  BJY:[id number] DOB: 05-28-1976 DOA: 08/05/2019 PCP: Elfredia Nevins, MD   Brief Narrative:  Patient is a 43 year old male with history of Hashimoto thyroiditis with subsequent hypothyroidism on Synthyroid who presented with shortness of breath, dysuria, cough, edema, abdomen pain.  He presented to the emergency department after he became suspicious that he had COVID S 19.  COVID - 19- twice.  Chest imaging study in the emergency department did not show any pneumonia, showed some interstitial edema and esophagitis.  There was high suspicion of COVID-19 so he was admitted.  Urine culture, blood culture sent.   Assessment & Plan:   Principal Problem:   Acute respiratory failure with hypoxia (HCC) Active Problems:   Hypothyroidism due to Hashimoto's thyroiditis   Nausea & vomiting   Acute respiratory failure with hypoxia: Presented with cough, nausea, vomiting, abdominal pain.  He was hypoxic on presentation.  Chest imagings as above.  Abd /pelvis CT didnot show any acute abnormalities.COVID-19- twice.  Respiratory virus panel is negative.  This morning his respiratory status stable.  We will monitor him on room air.  Currently he is hemodynamically stable.  Currently afebrile Chest CT showed some interstitial edema.  Echocardiogram showed ejection fraction of 65 to 70%, impaired relaxation.Currently euvolemic  Esophagitis: As per the CT imaging.  He presented with nausea and vomiting but denies any chest pain.  Started on Protonix.  Suspected urinary tract infection: Presented with dysuria.  Urinalysis not suggestive of UTI.  Urine culture did not show any significant growth.  Hypothyroidism: Was not taking Synthyroid since last 6 months.  We recommend to continue Synthyroid at home.  History of Hashimoto's thyroiditis with subsequent hypothyroidism.            DVT prophylaxis:Lovenox Code Status: Full Family Communication: Wife on  phone Disposition Plan: Home tomorrow   Consultants: None  Procedures:None  Antimicrobials:  Anti-infectives (From admission, onward)   Start     Dose/Rate Route Frequency Ordered Stop   08/05/19 0645  cefTRIAXone (ROCEPHIN) 1 g in sodium chloride 0.9 % 100 mL IVPB     1 g 200 mL/hr over 30 Minutes Intravenous  Once 08/05/19 0631 08/05/19 0728      Subjective:  Patient seen and examined the bedside this morning.  Hemodynamically stable.  Looks more comfortable.  He feels better.  Respiratory status is stable.  We will monitor him on room air.  Lungs are clear on auscultation.  Objective: Vitals:   08/05/19 1854 08/05/19 1858 08/05/19 2059 08/06/19 0758  BP: 130/82 130/82 127/86 119/60  Pulse:  92 (!) 103   Resp:  19 20   Temp: 98.5 F (36.9 C) 98.5 F (36.9 C) 99 F (37.2 C) 98.9 F (37.2 C)  TempSrc: Oral Oral Oral Oral  SpO2: 96%  96% 97%    Intake/Output Summary (Last 24 hours) at 08/06/2019 1242 Last data filed at 08/05/2019 2300 Gross per 24 hour  Intake 625 ml  Output --  Net 625 ml   There were no vitals filed for this visit.  Examination:  General exam: Not in distress,average built, generalized weakness HEENT:PERRL,Oral mucosa moist, Ear/Nose normal on gross exam Respiratory system: Bilateral equal air entry, normal vesicular breath sounds, no wheezes or crackles  Cardiovascular system: S1 & S2 heard, RRR. No JVD, murmurs, rubs, gallops or clicks. No pedal edema. Gastrointestinal system: Abdomen is nondistended, soft and nontender. No organomegaly or masses felt. Normal bowel sounds heard. Central nervous system: Alert and  oriented. No focal neurological deficits. Extremities: No edema, no clubbing ,no cyanosis, distal peripheral pulses palpable. Skin: No rashes, lesions or ulcers,no icterus ,no pallor MSK: Normal muscle bulk,tone ,power Psychiatry: Judgement and insight appear normal. Mood & affect appropriate.     Data Reviewed: I have personally  reviewed following labs and imaging studies  CBC: Recent Labs  Lab 08/05/19 0220 08/06/19 0444  WBC 11.1* 6.6  NEUTROABS 8.3* 3.6  HGB 14.5 12.4*  HCT 44.3 36.7*  MCV 97.8 96.3  PLT 452* 368   Basic Metabolic Panel: Recent Labs  Lab 08/05/19 0220 08/06/19 0444  NA 140 137  K 3.5 3.8  CL 105 103  CO2 21* 22  GLUCOSE 224* 123*  BUN 20 26*  CREATININE 1.30* 1.23  CALCIUM 8.6* 8.1*  MG  --  1.9  PHOS  --  3.0   GFR: CrCl cannot be calculated (Unknown ideal weight.). Liver Function Tests: Recent Labs  Lab 08/05/19 0220 08/06/19 0444  AST 26 21  ALT 32 22  ALKPHOS 59 39  BILITOT 0.6 0.7  PROT 6.4* 5.9*  ALBUMIN 4.0 3.5   No results for input(s): LIPASE, AMYLASE in the last 168 hours. No results for input(s): AMMONIA in the last 168 hours. Coagulation Profile: No results for input(s): INR, PROTIME in the last 168 hours. Cardiac Enzymes: No results for input(s): CKTOTAL, CKMB, CKMBINDEX, TROPONINI in the last 168 hours. BNP (last 3 results) No results for input(s): PROBNP in the last 8760 hours. HbA1C: No results for input(s): HGBA1C in the last 72 hours. CBG: No results for input(s): GLUCAP in the last 168 hours. Lipid Profile: Recent Labs    08/05/19 0220  TRIG 103   Thyroid Function Tests: Recent Labs    08/05/19 0558 08/05/19 0601  TSH  --  10.279*  T4TOTAL 6.5  --    Anemia Panel: Recent Labs    08/05/19 0220 08/06/19 0444  FERRITIN 37 61   Sepsis Labs: Recent Labs  Lab 08/05/19 0218 08/05/19 0220 08/05/19 0353 08/05/19 1715 08/05/19 1842  PROCALCITON  --  <0.10  --   --   --   LATICACIDVEN 1.8  --  2.7* 1.0 1.3    Recent Results (from the past 240 hour(s))  Blood Culture (routine x 2)     Status: None (Preliminary result)   Collection Time: 08/05/19  2:15 AM   Specimen: BLOOD  Result Value Ref Range Status   Specimen Description BLOOD LEFT ARM  Final   Special Requests   Final    BOTTLES DRAWN AEROBIC AND ANAEROBIC Blood  Culture results may not be optimal due to an inadequate volume of blood received in culture bottles   Culture   Final    NO GROWTH < 12 HOURS Performed at [facility], [address], [place], [place] [address]    Report Status PENDING  Incomplete  Blood Culture (routine x 2)     Status: None (Preliminary result)   Collection Time: 08/05/19  2:20 AM   Specimen: BLOOD  Result Value Ref Range Status   Specimen Description BLOOD RIGHT ARM  Final   Special Requests   Final    BOTTLES DRAWN AEROBIC AND ANAEROBIC Blood Culture results may not be optimal due to an inadequate volume of blood received in culture bottles   Culture   Final    NO GROWTH < 12 HOURS Performed at [facility], [address], [place], [place] [address]  Report Status PENDING  Incomplete  SARS Coronavirus 2 by RT PCR (hospital order, performed in [facility] hospital lab) Nasopharyngeal Nasopharyngeal Swab     Status: None   Collection Time: 08/05/19  2:32 AM   Specimen: Nasopharyngeal Swab  Result Value Ref Range Status   SARS Coronavirus 2 NEGATIVE NEGATIVE Final    Comment: (NOTE) If result is NEGATIVE SARS-CoV-2 target nucleic acids are NOT DETECTED. The SARS-CoV-2 RNA is generally detectable in upper and lower  respiratory specimens during the acute phase of infection. The lowest  concentration of SARS-CoV-2 viral copies this assay can detect is 250  copies / mL. A negative result does not preclude SARS-CoV-2 infection  and should not be used as the sole basis for treatment or other  patient management decisions.  A negative result may occur with  improper specimen collection / handling, submission of specimen other  than nasopharyngeal swab, presence of viral mutation(s) within the  areas targeted by this assay, and inadequate number of viral copies  (<250 copies / mL). A negative result must be combined with clinical  observations, patient history, and epidemiological information. If result  is POSITIVE SARS-CoV-2 target nucleic acids are DETECTED. The SARS-CoV-2 RNA is generally detectable in upper and lower  respiratory specimens dur ing the acute phase of infection.  Positive  results are indicative of active infection with SARS-CoV-2.  Clinical  correlation with patient history and other diagnostic information is  necessary to determine patient infection status.  Positive results do  not rule out bacterial infection or co-infection with other viruses. If result is PRESUMPTIVE POSTIVE SARS-CoV-2 nucleic acids MAY BE PRESENT.   A presumptive positive result was obtained on the submitted specimen  and confirmed on repeat testing.  While 2019 novel coronavirus  (SARS-CoV-2) nucleic acids may be present in the submitted sample  additional confirmatory testing may be necessary for epidemiological  and / or clinical management purposes  to differentiate between  SARS-CoV-2 and other Sarbecovirus currently known to infect humans.  If clinically indicated additional testing with an alternate test  methodology [phone]) is advised. The SARS-CoV-2 RNA is generally  detectable in upper and lower respiratory sp ecimens during the acute  phase of infection. The expected result is Negative. Fact Sheet for Patients:  StrictlyIdeas.no Fact Sheet for Healthcare Providers: BankingDealers.co.za This test is not yet approved or cleared by the [place] FDA and has been authorized for detection and/or diagnosis of SARS-CoV-2 by FDA under an Emergency Use Authorization (EUA).  This EUA will remain in effect (meaning this test can be used) for the duration of the COVID-19 declaration under Section 564(b)(1) of the Act, 21 U.S.C. section 360bbb-3(b)(1), unless the authorization is terminated or revoked sooner. Performed at [facility], [place] [address], [place], [place] [address]   Respiratory Panel by PCR     Status: None    Collection Time: 08/05/19 11:00 AM   Specimen: Nasopharyngeal Swab; Respiratory  Result Value Ref Range Status   Adenovirus NOT DETECTED NOT DETECTED Final   Coronavirus 229E NOT DETECTED NOT DETECTED Final    Comment: (NOTE) The Coronavirus on the Respiratory Panel, DOES NOT test for the novel  Coronavirus (2019 nCoV)    Coronavirus HKU1 NOT DETECTED NOT DETECTED Final   Coronavirus NL63 NOT DETECTED NOT DETECTED Final   Coronavirus OC43 NOT DETECTED NOT DETECTED Final   Metapneumovirus NOT DETECTED NOT DETECTED Final   Rhinovirus / Enterovirus NOT DETECTED NOT DETECTED Final   Influenza A  NOT DETECTED NOT DETECTED Final   Influenza B NOT DETECTED NOT DETECTED Final   Parainfluenza Virus 1 NOT DETECTED NOT DETECTED Final   Parainfluenza Virus 2 NOT DETECTED NOT DETECTED Final   Parainfluenza Virus 3 NOT DETECTED NOT DETECTED Final   Parainfluenza Virus 4 NOT DETECTED NOT DETECTED Final   Respiratory Syncytial Virus NOT DETECTED NOT DETECTED Final   Bordetella pertussis NOT DETECTED NOT DETECTED Final   Chlamydophila pneumoniae NOT DETECTED NOT DETECTED Final   Mycoplasma pneumoniae NOT DETECTED NOT DETECTED Final    Comment: Performed at [facility], [address], [place], [place] [address]  Culture, Urine     Status: Abnormal   Collection Time: 08/05/19 12:45 PM   Specimen: Urine, Random  Result Value Ref Range Status   Specimen Description URINE, RANDOM  Final   Special Requests NONE  Final   Culture (A)  Final    <10,000 COLONIES/mL INSIGNIFICANT GROWTH Performed at [facility], [address] [place], [place] [address]    Report Status 08/06/2019 FINAL  Final  SARS Coronavirus 2 by RT PCR (hospital order, performed in [facility] hospital lab) Nasopharyngeal Nasopharyngeal Swab     Status: None   Collection Time: 08/06/19  5:00 AM   Specimen: Nasopharyngeal Swab  Result Value Ref Range Status   SARS Coronavirus 2 NEGATIVE NEGATIVE Final    Comment:  (NOTE) If result is NEGATIVE SARS-CoV-2 target nucleic acids are NOT DETECTED. The SARS-CoV-2 RNA is generally detectable in upper and lower  respiratory specimens during the acute phase of infection. The lowest  concentration of SARS-CoV-2 viral copies this assay can detect is 250  copies / mL. A negative result does not preclude SARS-CoV-2 infection  and should not be used as the sole basis for treatment or other  patient management decisions.  A negative result may occur with  improper specimen collection / handling, submission of specimen other  than nasopharyngeal swab, presence of viral mutation(s) within the  areas targeted by this assay, and inadequate number of viral copies  (<250 copies / mL). A negative result must be combined with clinical  observations, patient history, and epidemiological information. If result is POSITIVE SARS-CoV-2 target nucleic acids are DETECTED. The SARS-CoV-2 RNA is generally detectable in upper and lower  respiratory specimens dur ing the acute phase of infection.  Positive  results are indicative of active infection with SARS-CoV-2.  Clinical  correlation with patient history and other diagnostic information is  necessary to determine patient infection status.  Positive results do  not rule out bacterial infection or co-infection with other viruses. If result is PRESUMPTIVE POSTIVE SARS-CoV-2 nucleic acids MAY BE PRESENT.   A presumptive positive result was obtained on the submitted specimen  and confirmed on repeat testing.  While 2019 novel coronavirus  (SARS-CoV-2) nucleic acids may be present in the submitted sample  additional confirmatory testing may be necessary for epidemiological  and / or clinical management purposes  to differentiate between  SARS-CoV-2 and other Sarbecovirus currently known to infect humans.  If clinically indicated additional testing with an alternate test  methodology [phone]) is advised. The SARS-CoV-2 RNA is  generally  detectable in upper and lower respiratory sp ecimens during the acute  phase of infection. The expected result is Negative. Fact Sheet for Patients:  BoilerBrush.com.cy Fact Sheet for Healthcare Providers: https://pope.com/ This test is not yet approved or cleared by the [place] FDA and has been authorized for detection and/or diagnosis  of SARS-CoV-2 by FDA under an Emergency Use Authorization (EUA).  This EUA will remain in effect (meaning this test can be used) for the duration of the COVID-19 declaration under Section 564(b)(1) of the Act, 21 U.S.C. section 360bbb-3(b)(1), unless the authorization is terminated or revoked sooner. Performed at [facility], [address] [place], [place] [id number]          Radiology Studies: Ct Abdomen Pelvis Wo Contrast  Result Date: 08/05/2019 CLINICAL DATA:  Abdominal distension and fever. EXAM: CT ABDOMEN AND PELVIS WITHOUT CONTRAST TECHNIQUE: Multidetector CT imaging of the abdomen and pelvis was performed following the standard protocol without IV contrast. COMPARISON:  None. FINDINGS: Lower chest: No acute abnormality. Hepatobiliary: Diffuse low density of liver is identified without focal liver lesion. The gallbladder is normal. The biliary tree is normal. Pancreas: Unremarkable. No pancreatic ductal dilatation or surrounding inflammatory changes. Spleen: Normal in size without focal abnormality. Adrenals/Urinary Tract: Adrenal glands are unremarkable. Kidneys are normal, without renal calculi, focal lesion, or hydronephrosis. Bladder is unremarkable. Residual contrast is identified in the renal collecting systems and the bladder. Stomach/Bowel: Small hiatal hernia is identified. The stomach is otherwise normal. The small bowel is normal without evidence of bowel obstruction. Colon is normal. There is no diverticulitis. The appendix is normal. Vascular/Lymphatic: No  significant vascular findings are present. No enlarged abdominal or pelvic lymph nodes. Reproductive: Prostate gland is normal. Other: Small amount of free fluid is identified in the pelvis, nonspecific. Musculoskeletal: No acute abnormality identified. IMPRESSION: Fatty infiltration of liver. Small amount of free fluid identified in the pelvis, nonspecific. Otherwise no acute abnormality identified in the abdomen and pelvis. Electronically Signed   By: Sherian ReinWei-Chen  Lin M.D.   On: 08/05/2019 15:27   Ct Angio Chest Pe W And/or Wo Contrast  Result Date: 08/05/2019 CLINICAL DATA:  Shortness of breath and cough and abdominal pain. Pulmonary embolism suspected EXAM: CT ANGIOGRAPHY CHEST WITH CONTRAST TECHNIQUE: Multidetector CT imaging of the chest was performed using the standard protocol during bolus administration of intravenous contrast. Multiplanar CT image reconstructions and MIPs were obtained to evaluate the vascular anatomy. CONTRAST:  100mL OMNIPAQUE IOHEXOL 350 MG/ML SOLN COMPARISON:  None. FINDINGS: Cardiovascular: Normal heart size. No pericardial effusion. No pulmonary artery filling defect. Negative aorta Mediastinum/Nodes: Diffuse circumferential wall thickening of the esophagus with surrounding fat haziness. No adenopathy. Lungs/Pleura: Generalized bronchial wall and interlobular septal thickening. No consolidation, effusion, or pneumothorax Upper Abdomen: Distended stomach. Hepatic steatosis with pericholecystic sparing. Musculoskeletal: No acute finding. Bridging midthoracic osteophytes. Review of the MIP images confirms the above findings. IMPRESSION: 1. Findings of esophagitis. 2. Pulmonary interstitial edema. 3. Negative for pulmonary embolism or cardiomegaly. Electronically Signed   By: Marnee SpringJonathon  Watts M.D.   On: 08/05/2019 05:41   Dg Chest Port 1 View  Result Date: 08/05/2019 CLINICAL DATA:  Hypoxia EXAM: PORTABLE CHEST 1 VIEW COMPARISON:  None. FINDINGS: The heart size and mediastinal  contours are within normal limits. Both lungs are clear. The visualized skeletal structures are unremarkable. IMPRESSION: No acute cardiopulmonary process. Electronically Signed   By: Jonna ClarkBindu  Avutu M.D.   On: 08/05/2019 02:37        Scheduled Meds:  docusate sodium  100 mg Oral BID   enoxaparin (LOVENOX) injection  40 mg Subcutaneous Q24H   levothyroxine  50 mcg Oral Q0600   pantoprazole (PROTONIX) IV  40 mg Intravenous Q12H   sodium chloride flush  3 mL Intravenous Q12H   sodium chloride flush  3 mL  Intravenous Q12H   Continuous Infusions:  sodium chloride       LOS: 1 day    Time spent:25 mins. More than 50% of that time was spent in counseling and/or coordination of care.      Burnadette Pop, MD [facility] Pager [phone]  If 7PM-7AM, please contact night-coverage [email or web address] Password [email or web address] 08/06/2019, 12:42 PM

## 2019-08-07 LAB — BASIC METABOLIC PANEL
Anion gap: 12 (ref 5–15)
BUN: 16 mg/dL (ref 6–20)
CO2: 21 mmol/L — ABNORMAL LOW (ref 22–32)
Calcium: 8.7 mg/dL — ABNORMAL LOW (ref 8.9–10.3)
Chloride: 105 mmol/L (ref 98–111)
Creatinine, Ser: 1.09 mg/dL (ref 0.61–1.24)
GFR calc Af Amer: >60 mL/min (ref >60)
GFR calc non Af Amer: >60 mL/min (ref >60)
Glucose, Bld: 111 mg/dL — ABNORMAL HIGH (ref 70–99)
Potassium: 3.7 mmol/L (ref 3.5–5.1)
Sodium: 138 mmol/L (ref 135–145)

## 2019-08-07 LAB — C-REACTIVE PROTEIN: CRP: 6.6 mg/dL — ABNORMAL HIGH (ref <1.0)

## 2019-08-07 MED ORDER — ALBUTEROL SULFATE (2.5 MG/3ML) 0.083% IN NEBU
INHALATION_SOLUTION | RESPIRATORY_TRACT | Status: AC
  Administered 2019-08-07: 2.5 mg
  Filled 2019-08-07: qty 3

## 2019-08-07 MED ORDER — CEPHALEXIN 500 MG PO CAPS
500.0000 mg | ORAL_CAPSULE | Freq: Two times a day (BID) | ORAL | Status: DC
  Administered 2019-08-07 – 2019-08-08 (×3): 500 mg via ORAL
  Filled 2019-08-07 (×3): qty 1

## 2019-08-07 MED ORDER — FUROSEMIDE 10 MG/ML IJ SOLN
20.0000 mg | Freq: Once | INTRAMUSCULAR | Status: AC
  Administered 2019-08-07: 20 mg via INTRAVENOUS
  Filled 2019-08-07: qty 2

## 2019-08-07 MED ORDER — ALBUTEROL SULFATE (2.5 MG/3ML) 0.083% IN NEBU: 2.5000 mg | INHALATION_SOLUTION | Freq: Four times a day (QID) | RESPIRATORY_TRACT | Status: DC | PRN

## 2019-08-07 MED ORDER — BENZONATATE 100 MG PO CAPS
200.0000 mg | ORAL_CAPSULE | Freq: Three times a day (TID) | ORAL | Status: DC | PRN
  Administered 2019-08-07 – 2019-08-08 (×4): 200 mg via ORAL
  Filled 2019-08-07 (×4): qty 2

## 2019-08-07 MED ORDER — POTASSIUM CHLORIDE CRYS ER 20 MEQ PO TBCR
20.0000 meq | EXTENDED_RELEASE_TABLET | Freq: Once | ORAL | Status: AC
  Administered 2019-08-07: 20 meq via ORAL
  Filled 2019-08-07: qty 1

## 2019-08-07 NOTE — Progress Notes (Signed)
PROGRESS NOTE    Eddie Ray  RUE:[name]:[id number] DOB: 04/06/1976 DOA: 08/05/2019 PCP: Elfredia NevinsFusco, Lawrence, MD   Brief Narrative: 43 year old with past medical history of Hashimoto thyroiditis with subsequent hypothyroidism on Synthroid who presented with shortness of breath, dysuria, cough edema abdominal pain.  He presented emergency department after he became suspicious that he had coffee.  COVID-19 negative twice.  CT chest showed some interstitial edema and esophagitis.  There was high suspicion for coverage so he was admitted. Covid 19 times 2 negative Echocardiogram consistent with diastolic dysfunction normal systolic function.  Assessment & Plan:   Principal Problem:   Acute respiratory failure with hypoxia (HCC) Active Problems:   Hypothyroidism due to Hashimoto's thyroiditis   Nausea & vomiting   1-acute hypoxic respiratory failure: Patient presented with cough, shortness of breath, nausea.  He was hypoxic on presentation.  A CT chest showed bilateral pulmonary edema.  BNP was normal.  Echocardiogram showed diastolic dysfunction. -Continue to have dry cough and mild dyspnea.  Will give 20 mg of IV Lexis. -Tensilon pill, nebulizer as needed.  2-esophagitis: Continue with PPI.  3-UTI: Presented with dysuria, continue to complain of dysuria. Received ceftriaxone in the ED.  We will give 2 days of Keflex.  4-hypothyroidism: Continue with Synthroid. Counseling about compliance with Synthroid to avoid further worsening of diastolic dysfunction and heart failure.  5-fatty liver: Counseling provided in regards diet to avoid progression to NASH   Estimated body mass index is 31.01 kg/m as calculated from the following:   Height as of 03/16/17: 5\' 9"  (1.753 m).   Weight as of 03/16/17: 95.3 kg.   DVT prophylaxis: Lovenox Code Status: Full code Family Communication: Care discussed with patient Disposition Plan: Challenge with IV [facility] Consultants:   None  Procedures:    Echo: Diastolic dysfunction normal systolic function  Antimicrobials:  Keflex  Subjective: Still complaining of dysuria.  He is still complaining of dry cough and mild shortness of breath  Objective: Vitals:   08/06/19 1100 08/06/19 1615 08/06/19 2325 08/07/19 0840  BP:  139/80 118/87 (!) 140/94  Pulse:  89 87 90  Resp:  16 18 16   Temp:  98.3 F (36.8 C) 98.5 F (36.9 C) 98.4 F (36.9 C)  TempSrc:  Oral Oral Oral  SpO2: 95% 97% 96% 99%    Intake/Output Summary (Last 24 hours) at 08/07/2019 0848 Last data filed at 08/06/2019 1400 Gross per 24 hour  Intake -  Output 200 ml  Net -200 ml   There were no vitals filed for this visit.  Examination:  General exam: Appears calm and comfortable  Respiratory system: Bilateral crackles Cardiovascular system: S1 & S2 heard, RRR. No JVD, murmurs, rubs, gallops or clicks. No pedal edema. Gastrointestinal system: Abdomen is nondistended, soft and nontender. No organomegaly or masses felt. Normal bowel sounds heard. Central nervous system: Alert and oriented. No focal neurological deficits. Extremities: Symmetric 5 x 5 power. Skin: No rashes, lesions or ulcers Psychiatry: Judgement and insight appear normal. Mood & affect appropriate.     Data Reviewed: I have personally reviewed following labs and imaging studies  CBC: Recent Labs  Lab 08/05/19 0220 08/06/19 0444  WBC 11.1* 6.6  NEUTROABS 8.3* 3.6  HGB 14.5 12.4*  HCT 44.3 36.7*  MCV 97.8 96.3  PLT 452* 368   Basic Metabolic Panel: Recent Labs  Lab 08/05/19 0220 08/06/19 0444  NA 140 137  K 3.5 3.8  CL 105 103  CO2 21* 22  GLUCOSE 224* 123*  BUN  20 26*  CREATININE 1.30* 1.23  CALCIUM 8.6* 8.1*  MG  --  1.9  PHOS  --  3.0   GFR: CrCl cannot be calculated (Unknown ideal weight.). Liver Function Tests: Recent Labs  Lab 08/05/19 0220 08/06/19 0444  AST 26 21  ALT 32 22  ALKPHOS 59 39  BILITOT 0.6 0.7  PROT 6.4* 5.9*  ALBUMIN 4.0 3.5   No results for  input(s): LIPASE, AMYLASE in the last 168 hours. No results for input(s): AMMONIA in the last 168 hours. Coagulation Profile: No results for input(s): INR, PROTIME in the last 168 hours. Cardiac Enzymes: No results for input(s): CKTOTAL, CKMB, CKMBINDEX, TROPONINI in the last 168 hours. BNP (last 3 results) No results for input(s): PROBNP in the last 8760 hours. HbA1C: No results for input(s): HGBA1C in the last 72 hours. CBG: No results for input(s): GLUCAP in the last 168 hours. Lipid Profile: Recent Labs    08/05/19 0220  TRIG 103   Thyroid Function Tests: Recent Labs    08/05/19 0558 08/05/19 0601  TSH  --  10.279*  T4TOTAL 6.5  --    Anemia Panel: Recent Labs    08/05/19 0220 08/06/19 0444  FERRITIN 37 61   Sepsis Labs: Recent Labs  Lab 08/05/19 0218 08/05/19 0220 08/05/19 0353 08/05/19 1715 08/05/19 1842  PROCALCITON  --  <0.10  --   --   --   LATICACIDVEN 1.8  --  2.7* 1.0 1.3    Recent Results (from the past 240 hour(s))  Blood Culture (routine x 2)     Status: None (Preliminary result)   Collection Time: 08/05/19  2:15 AM   Specimen: BLOOD  Result Value Ref Range Status   Specimen Description BLOOD LEFT ARM  Final   Special Requests   Final    BOTTLES DRAWN AEROBIC AND ANAEROBIC Blood Culture results may not be optimal due to an inadequate volume of blood received in culture bottles   Culture   Final    NO GROWTH 1 DAY Performed at [facility], [address], [place], [place] [address]    Report Status PENDING  Incomplete  Blood Culture (routine x 2)     Status: None (Preliminary result)   Collection Time: 08/05/19  2:20 AM   Specimen: BLOOD  Result Value Ref Range Status   Specimen Description BLOOD RIGHT ARM  Final   Special Requests   Final    BOTTLES DRAWN AEROBIC AND ANAEROBIC Blood Culture results may not be optimal due to an inadequate volume of blood received in culture bottles   Culture   Final    NO GROWTH 1 DAY Performed  at [facility], [address], [place], [place] [address]    Report Status PENDING  Incomplete  SARS Coronavirus 2 by RT PCR (hospital order, performed in [facility] hospital lab) Nasopharyngeal Nasopharyngeal Swab     Status: None   Collection Time: 08/05/19  2:32 AM   Specimen: Nasopharyngeal Swab  Result Value Ref Range Status   SARS Coronavirus 2 NEGATIVE NEGATIVE Final    Comment: (NOTE) If result is NEGATIVE SARS-CoV-2 target nucleic acids are NOT DETECTED. The SARS-CoV-2 RNA is generally detectable in upper and lower  respiratory specimens during the acute phase of infection. The lowest  concentration of SARS-CoV-2 viral copies this assay can detect is 250  copies / mL. A negative result does not preclude SARS-CoV-2 infection  and should not be used as the sole  basis for treatment or other  patient management decisions.  A negative result may occur with  improper specimen collection / handling, submission of specimen other  than nasopharyngeal swab, presence of viral mutation(s) within the  areas targeted by this assay, and inadequate number of viral copies  (<250 copies / mL). A negative result must be combined with clinical  observations, patient history, and epidemiological information. If result is POSITIVE SARS-CoV-2 target nucleic acids are DETECTED. The SARS-CoV-2 RNA is generally detectable in upper and lower  respiratory specimens dur ing the acute phase of infection.  Positive  results are indicative of active infection with SARS-CoV-2.  Clinical  correlation with patient history and other diagnostic information is  necessary to determine patient infection status.  Positive results do  not rule out bacterial infection or co-infection with other viruses. If result is PRESUMPTIVE POSTIVE SARS-CoV-2 nucleic acids MAY BE PRESENT.   A presumptive positive result was obtained on the submitted specimen  and confirmed on repeat testing.  While 2019 novel  coronavirus  (SARS-CoV-2) nucleic acids may be present in the submitted sample  additional confirmatory testing may be necessary for epidemiological  and / or clinical management purposes  to differentiate between  SARS-CoV-2 and other Sarbecovirus currently known to infect humans.  If clinically indicated additional testing with an alternate test  methodology [phone]) is advised. The SARS-CoV-2 RNA is generally  detectable in upper and lower respiratory sp ecimens during the acute  phase of infection. The expected result is Negative. Fact Sheet for Patients:  BoilerBrush.com.cy Fact Sheet for Healthcare Providers: https://pope.com/ This test is not yet approved or cleared by the [place] FDA and has been authorized for detection and/or diagnosis of SARS-CoV-2 by FDA under an Emergency Use Authorization (EUA).  This EUA will remain in effect (meaning this test can be used) for the duration of the COVID-19 declaration under Section 564(b)(1) of the Act, 21 U.S.C. section 360bbb-3(b)(1), unless the authorization is terminated or revoked sooner. Performed at [facility], [address], [place], [place] [address]   Respiratory Panel by PCR     Status: None   Collection Time: 08/05/19 11:00 AM   Specimen: Nasopharyngeal Swab; Respiratory  Result Value Ref Range Status   Adenovirus NOT DETECTED NOT DETECTED Final   Coronavirus 229E NOT DETECTED NOT DETECTED Final    Comment: (NOTE) The Coronavirus on the Respiratory Panel, DOES NOT test for the novel  Coronavirus (2019 nCoV)    Coronavirus HKU1 NOT DETECTED NOT DETECTED Final   Coronavirus NL63 NOT DETECTED NOT DETECTED Final   Coronavirus OC43 NOT DETECTED NOT DETECTED Final   Metapneumovirus NOT DETECTED NOT DETECTED Final   Rhinovirus / Enterovirus NOT DETECTED NOT DETECTED Final   Influenza A NOT DETECTED NOT DETECTED Final   Influenza B NOT DETECTED NOT DETECTED Final    Parainfluenza Virus 1 NOT DETECTED NOT DETECTED Final   Parainfluenza Virus 2 NOT DETECTED NOT DETECTED Final   Parainfluenza Virus 3 NOT DETECTED NOT DETECTED Final   Parainfluenza Virus 4 NOT DETECTED NOT DETECTED Final   Respiratory Syncytial Virus NOT DETECTED NOT DETECTED Final   Bordetella pertussis NOT DETECTED NOT DETECTED Final   Chlamydophila pneumoniae NOT DETECTED NOT DETECTED Final   Mycoplasma pneumoniae NOT DETECTED NOT DETECTED Final    Comment: Performed at [facility], [address], [place], [place] [address]  Culture, Urine     Status: Abnormal   Collection Time: 08/05/19 12:45 PM  Specimen: Urine, Random  Result Value Ref Range Status   Specimen Description URINE, RANDOM  Final   Special Requests NONE  Final   Culture (A)  Final    <10,000 COLONIES/mL INSIGNIFICANT GROWTH Performed at [facility], [place] [address], [place], [place] [address]    Report Status 08/06/2019 FINAL  Final  SARS Coronavirus 2 by RT PCR (hospital order, performed in [facility] hospital lab) Nasopharyngeal Nasopharyngeal Swab     Status: None   Collection Time: 08/06/19  5:00 AM   Specimen: Nasopharyngeal Swab  Result Value Ref Range Status   SARS Coronavirus 2 NEGATIVE NEGATIVE Final    Comment: (NOTE) If result is NEGATIVE SARS-CoV-2 target nucleic acids are NOT DETECTED. The SARS-CoV-2 RNA is generally detectable in upper and lower  respiratory specimens during the acute phase of infection. The lowest  concentration of SARS-CoV-2 viral copies this assay can detect is 250  copies / mL. A negative result does not preclude SARS-CoV-2 infection  and should not be used as the sole basis for treatment or other  patient management decisions.  A negative result may occur with  improper specimen collection / handling, submission of specimen other  than nasopharyngeal swab, presence of viral mutation(s) within the  areas targeted by this assay, and inadequate number of viral  copies  (<250 copies / mL). A negative result must be combined with clinical  observations, patient history, and epidemiological information. If result is POSITIVE SARS-CoV-2 target nucleic acids are DETECTED. The SARS-CoV-2 RNA is generally detectable in upper and lower  respiratory specimens dur ing the acute phase of infection.  Positive  results are indicative of active infection with SARS-CoV-2.  Clinical  correlation with patient history and other diagnostic information is  necessary to determine patient infection status.  Positive results do  not rule out bacterial infection or co-infection with other viruses. If result is PRESUMPTIVE POSTIVE SARS-CoV-2 nucleic acids MAY BE PRESENT.   A presumptive positive result was obtained on the submitted specimen  and confirmed on repeat testing.  While 2019 novel coronavirus  (SARS-CoV-2) nucleic acids may be present in the submitted sample  additional confirmatory testing may be necessary for epidemiological  and / or clinical management purposes  to differentiate between  SARS-CoV-2 and other Sarbecovirus currently known to infect humans.  If clinically indicated additional testing with an alternate test  methodology [phone]) is advised. The SARS-CoV-2 RNA is generally  detectable in upper and lower respiratory sp ecimens during the acute  phase of infection. The expected result is Negative. Fact Sheet for Patients:  StrictlyIdeas.no Fact Sheet for Healthcare Providers: BankingDealers.co.za This test is not yet approved or cleared by the [place] FDA and has been authorized for detection and/or diagnosis of SARS-CoV-2 by FDA under an Emergency Use Authorization (EUA).  This EUA will remain in effect (meaning this test can be used) for the duration of the COVID-19 declaration under Section 564(b)(1) of the Act, 21 U.S.C. section 360bbb-3(b)(1), unless the authorization is terminated  or revoked sooner. Performed at [facility], [place] [address] [place], [place] [address]          Radiology Studies: Ct Abdomen Pelvis Wo Contrast  Result Date: 08/05/2019 CLINICAL DATA:  Abdominal distension and fever. EXAM: CT ABDOMEN AND PELVIS WITHOUT CONTRAST TECHNIQUE: Multidetector CT imaging of the abdomen and pelvis was performed following the standard protocol without IV contrast. COMPARISON:  None. FINDINGS: Lower chest: No acute abnormality. Hepatobiliary: Diffuse low density  of liver is identified without focal liver lesion. The gallbladder is normal. The biliary tree is normal. Pancreas: Unremarkable. No pancreatic ductal dilatation or surrounding inflammatory changes. Spleen: Normal in size without focal abnormality. Adrenals/Urinary Tract: Adrenal glands are unremarkable. Kidneys are normal, without renal calculi, focal lesion, or hydronephrosis. Bladder is unremarkable. Residual contrast is identified in the renal collecting systems and the bladder. Stomach/Bowel: Small hiatal hernia is identified. The stomach is otherwise normal. The small bowel is normal without evidence of bowel obstruction. Colon is normal. There is no diverticulitis. The appendix is normal. Vascular/Lymphatic: No significant vascular findings are present. No enlarged abdominal or pelvic lymph nodes. Reproductive: Prostate gland is normal. Other: Small amount of free fluid is identified in the pelvis, nonspecific. Musculoskeletal: No acute abnormality identified. IMPRESSION: Fatty infiltration of liver. Small amount of free fluid identified in the pelvis, nonspecific. Otherwise no acute abnormality identified in the abdomen and pelvis. Electronically Signed   By: Sherian Rein M.D.   On: 08/05/2019 15:27        Scheduled Meds: . cephALEXin  500 mg Oral Q12H  . docusate sodium  100 mg Oral BID  . enoxaparin (LOVENOX) injection  40 mg Subcutaneous Q24H  . furosemide  20 mg Intravenous Once  .  levothyroxine  50 mcg Oral Q0600  . pantoprazole  40 mg Oral BID  . sodium chloride flush  3 mL Intravenous Q12H  . sodium chloride flush  3 mL Intravenous Q12H   Continuous Infusions: . sodium chloride       LOS: 2 days    Time spent: 35 minutes    Alba Cory, MD [facility] Pager [phone]  If 7PM-7AM, please contact night-coverage [email or web address] Password [facility] 08/07/2019, 8:48 AM

## 2019-08-07 NOTE — Plan of Care (Signed)
  Problem: Education: Goal: Knowledge of risk factors and measures for prevention of condition will improve Outcome: Progressing   Problem: Coping: Goal: Psychosocial and spiritual needs will be supported Outcome: Progressing   Problem: Respiratory: Goal: Will maintain a patent airway Outcome: Progressing Goal: Complications related to the disease process, condition or treatment will be avoided or minimized Outcome: Progressing   Problem: Education: Goal: Knowledge of General Education information will improve Description: Including pain rating scale, medication(s)/side effects and non-pharmacologic comfort measures Outcome: Progressing   Problem: Clinical Measurements: Goal: Will remain free from infection Outcome: Progressing   Problem: Activity: Goal: Risk for activity intolerance will decrease Outcome: Progressing   Problem: Nutrition: Goal: Adequate nutrition will be maintained Outcome: Progressing   Problem: Pain Managment: Goal: General experience of comfort will improve Outcome: Progressing   Problem: Safety: Goal: Ability to remain free from injury will improve Outcome: Progressing   Problem: Skin Integrity: Goal: Risk for impaired skin integrity will decrease Outcome: Progressing

## 2019-08-07 NOTE — Plan of Care (Signed)
  Problem: Education: Goal: Knowledge of risk factors and measures for prevention of condition will improve Outcome: Progressing   Problem: Coping: Goal: Psychosocial and spiritual needs will be supported Outcome: Progressing   Problem: Education: Goal: Knowledge of General Education information will improve Description: Including pain rating scale, medication(s)/side effects and non-pharmacologic comfort measures Outcome: Progressing   Problem: Health Behavior/Discharge Planning: Goal: Ability to manage health-related needs will improve Outcome: Progressing   Problem: Clinical Measurements: Goal: Respiratory complications will improve Outcome: Progressing

## 2019-08-08 LAB — BASIC METABOLIC PANEL
Anion gap: 9 (ref 5–15)
BUN: 14 mg/dL (ref 6–20)
CO2: 25 mmol/L (ref 22–32)
Calcium: 9 mg/dL (ref 8.9–10.3)
Chloride: 105 mmol/L (ref 98–111)
Creatinine, Ser: 1.12 mg/dL (ref 0.61–1.24)
GFR calc Af Amer: >60 mL/min (ref >60)
GFR calc non Af Amer: >60 mL/min (ref >60)
Glucose, Bld: 109 mg/dL — ABNORMAL HIGH (ref 70–99)
Potassium: 3.8 mmol/L (ref 3.5–5.1)
Sodium: 139 mmol/L (ref 135–145)

## 2019-08-08 LAB — HEMOGLOBIN A1C
Hgb A1c MFr Bld: 5.7 % — ABNORMAL HIGH (ref 4.8–5.6)
Mean Plasma Glucose: 116.89 mg/dL

## 2019-08-08 MED ORDER — DOCUSATE SODIUM 100 MG PO CAPS: 100.0000 mg | ORAL_CAPSULE | Freq: Two times a day (BID) | ORAL | 0 refills | Status: DC

## 2019-08-08 MED ORDER — CEPHALEXIN 500 MG PO CAPS: 500.0000 mg | ORAL_CAPSULE | Freq: Two times a day (BID) | ORAL | 0 refills | Status: DC

## 2019-08-08 MED ORDER — POTASSIUM CHLORIDE ER 10 MEQ PO TBCR: 10.0000 meq | EXTENDED_RELEASE_TABLET | Freq: Every day | ORAL | 0 refills | Status: DC

## 2019-08-08 MED ORDER — LEVOTHYROXINE SODIUM 50 MCG PO TABS: 50.0000 ug | ORAL_TABLET | Freq: Every day | ORAL | 5 refills | Status: DC

## 2019-08-08 MED ORDER — BENZONATATE 200 MG PO CAPS: 200.0000 mg | ORAL_CAPSULE | Freq: Three times a day (TID) | ORAL | 0 refills | Status: DC | PRN

## 2019-08-08 MED ORDER — PANTOPRAZOLE SODIUM 40 MG PO TBEC: 40.0000 mg | DELAYED_RELEASE_TABLET | Freq: Two times a day (BID) | ORAL | 0 refills | Status: DC

## 2019-08-08 MED ORDER — FUROSEMIDE 20 MG PO TABS
10.0000 mg | ORAL_TABLET | Freq: Every day | ORAL | Status: DC
  Administered 2019-08-08: 10 mg via ORAL
  Filled 2019-08-08: qty 1

## 2019-08-08 MED ORDER — POTASSIUM CHLORIDE CRYS ER 20 MEQ PO TBCR
20.0000 meq | EXTENDED_RELEASE_TABLET | Freq: Once | ORAL | Status: AC
  Administered 2019-08-08: 20 meq via ORAL
  Filled 2019-08-08: qty 1

## 2019-08-08 MED ORDER — FUROSEMIDE 20 MG PO TABS: 10.0000 mg | ORAL_TABLET | Freq: Every day | ORAL | 0 refills | Status: DC

## 2019-08-08 NOTE — Discharge Summary (Addendum)
Physician Discharge Summary  Eddie Ray XBM:[id number] DOB: [birth year] DOA: 08/05/2019  PCP: Redmond School, MD  Admit date: 08/05/2019 Discharge date: 08/08/2019  Admitted From: Home  Disposition:  Home   Recommendations for Outpatient Follow-up:  1. Follow up with PCP in 1-2 weeks 2. Please obtain BMP/CBC in one week 3. Please follow Hb-A1c, repeat b-met to monitor renal function on lasix.  4. Referral to cardiology for evaluation of diastolic dysfunction.  5. Counseling regards diet and compliance with synthroid.   Home Health: none  Discharge Condition: Stable.  CODE STATUS: Full code Diet recommendation: Heart Healthy   Brief/Interim Summary: 43 year old with past medical history of Hashimoto thyroiditis with subsequent hypothyroidism on Synthroid who presented with shortness of breath, dysuria, cough edema abdominal pain.  He presented emergency department after he became suspicious that he had coffee.  COVID-19 negative twice.  CT chest showed some interstitial edema and esophagitis.  There was high suspicion for coverage so he was admitted. Covid 19 times 2 negative Echocardiogram consistent with diastolic dysfunction normal systolic function.  1-Acute hypoxic respiratory failure: related to pulmonary edema related to acute diastolic heart failure, . Patient presented with cough, shortness of breath, nausea.  He was hypoxic on presentation.  A CT chest showed bilateral pulmonary edema.  BNP was normal.  Echocardiogram showed diastolic dysfunction. -Continue to have dry cough and mild dyspnea.  Will give 20 mg of IV lasix. Symptoms improved. Discharge on low dose lasix and potasium supplement.  -Tensilon pill, nebulizer as needed. -advised compliance with synthroid.   2-Esophagitis: Continue with PPI.  3-UTI: Presented with dysuria, continue to complain of dysuria. Received ceftriaxone in the ED. Received 3 doses of keflex in the hospital , needs one more dose at  discharge  4-Hypothyroidism: Continue with Synthroid. Counseling about compliance with Synthroid to avoid further worsening of diastolic dysfunction and heart failure.  5-fatty liver: Counseling provided in regards diet to avoid progression to NASH    Discharge Diagnoses:  Principal Problem:   Acute respiratory failure with hypoxia ([place]) Active Problems:   Hypothyroidism due to Hashimoto's thyroiditis   Nausea & vomiting    Discharge Instructions  Discharge Instructions    Diet - low sodium heart healthy   Complete by: As directed    Increase activity slowly   Complete by: As directed      Allergies as of 08/08/2019   No Known Allergies     Medication List    TAKE these medications   benzonatate 200 MG capsule Commonly known as: TESSALON Take 1 capsule (200 mg total) by mouth 3 (three) times daily as needed for cough.   cephALEXin 500 MG capsule Commonly known as: KEFLEX Take 1 capsule (500 mg total) by mouth every 12 (twelve) hours.   docusate sodium 100 MG capsule Commonly known as: COLACE Take 1 capsule (100 mg total) by mouth 2 (two) times daily.   furosemide 20 MG tablet Commonly known as: LASIX Take 0.5 tablets (10 mg total) by mouth daily.   levothyroxine 50 MCG tablet Commonly known as: SYNTHROID Take 1 tablet (50 mcg total) by mouth daily at 6 (six) AM. Start taking on: August 09, 2019 What changed: See the new instructions.   pantoprazole 40 MG tablet Commonly known as: PROTONIX Take 1 tablet (40 mg total) by mouth 2 (two) times daily.   potassium chloride 10 MEQ tablet Commonly known as: KLOR-CON Take 1 tablet (10 mEq total) by mouth daily.       No Known Allergies  Consultations:  none   Procedures/Studies: Ct Abdomen Pelvis Wo Contrast  Result Date: 08/05/2019 CLINICAL DATA:  Abdominal distension and fever. EXAM: CT ABDOMEN AND PELVIS WITHOUT CONTRAST TECHNIQUE: Multidetector CT imaging of the abdomen and pelvis was  performed following the standard protocol without IV contrast. COMPARISON:  None. FINDINGS: Lower chest: No acute abnormality. Hepatobiliary: Diffuse low density of liver is identified without focal liver lesion. The gallbladder is normal. The biliary tree is normal. Pancreas: Unremarkable. No pancreatic ductal dilatation or surrounding inflammatory changes. Spleen: Normal in size without focal abnormality. Adrenals/Urinary Tract: Adrenal glands are unremarkable. Kidneys are normal, without renal calculi, focal lesion, or hydronephrosis. Bladder is unremarkable. Residual contrast is identified in the renal collecting systems and the bladder. Stomach/Bowel: Small hiatal hernia is identified. The stomach is otherwise normal. The small bowel is normal without evidence of bowel obstruction. Colon is normal. There is no diverticulitis. The appendix is normal. Vascular/Lymphatic: No significant vascular findings are present. No enlarged abdominal or pelvic lymph nodes. Reproductive: Prostate gland is normal. Other: Small amount of free fluid is identified in the pelvis, nonspecific. Musculoskeletal: No acute abnormality identified. IMPRESSION: Fatty infiltration of liver. Small amount of free fluid identified in the pelvis, nonspecific. Otherwise no acute abnormality identified in the abdomen and pelvis. Electronically Signed   By: Abelardo Diesel M.D.   On: 08/05/2019 15:27   Ct Angio Chest Pe W And/or Wo Contrast  Result Date: 08/05/2019 CLINICAL DATA:  Shortness of breath and cough and abdominal pain. Pulmonary embolism suspected EXAM: CT ANGIOGRAPHY CHEST WITH CONTRAST TECHNIQUE: Multidetector CT imaging of the chest was performed using the standard protocol during bolus administration of intravenous contrast. Multiplanar CT image reconstructions and MIPs were obtained to evaluate the vascular anatomy. CONTRAST:  153m OMNIPAQUE IOHEXOL 350 MG/ML SOLN COMPARISON:  None. FINDINGS: Cardiovascular: Normal heart size.  No pericardial effusion. No pulmonary artery filling defect. Negative aorta Mediastinum/Nodes: Diffuse circumferential wall thickening of the esophagus with surrounding fat haziness. No adenopathy. Lungs/Pleura: Generalized bronchial wall and interlobular septal thickening. No consolidation, effusion, or pneumothorax Upper Abdomen: Distended stomach. Hepatic steatosis with pericholecystic sparing. Musculoskeletal: No acute finding. Bridging midthoracic osteophytes. Review of the MIP images confirms the above findings. IMPRESSION: 1. Findings of esophagitis. 2. Pulmonary interstitial edema. 3. Negative for pulmonary embolism or cardiomegaly. Electronically Signed   By: JMonte FantasiaM.D.   On: 08/05/2019 05:41   Dg Chest Port 1 View  Result Date: 08/05/2019 CLINICAL DATA:  Hypoxia EXAM: PORTABLE CHEST 1 VIEW COMPARISON:  None. FINDINGS: The heart size and mediastinal contours are within normal limits. Both lungs are clear. The visualized skeletal structures are unremarkable. IMPRESSION: No acute cardiopulmonary process. Electronically Signed   By: BPrudencio PairM.D.   On: 08/05/2019 02:37      Subjective: Dyspnea has improved. Report mild abdominal discomfort after taking medications this am.  I discussed with him GERD prevention.   Discharge Exam: Vitals:   08/07/19 1603 08/08/19 0759  BP: 134/88 125/84  Pulse: 81 70  Resp: 16   Temp: 98.3 F (36.8 C) 98.5 F (36.9 C)  SpO2: 97% 95%     General: Pt is alert, awake, not in acute distress Cardiovascular: RRR, S1/S2 +, no rubs, no gallops Respiratory: CTA bilaterally, no wheezing, no rhonchi Abdominal: Soft, NT, ND, bowel sounds + Extremities: no edema, no cyanosis    The results of significant diagnostics from this hospitalization (including imaging, microbiology, ancillary and laboratory) are listed below for reference.     Microbiology:  Recent Results (from the past 240 hour(s))  Blood Culture (routine x 2)     Status: None  (Preliminary result)   Collection Time: 08/05/19  2:15 AM   Specimen: BLOOD  Result Value Ref Range Status   Specimen Description BLOOD LEFT ARM  Final   Special Requests   Final    BOTTLES DRAWN AEROBIC AND ANAEROBIC Blood Culture results may not be optimal due to an inadequate volume of blood received in culture bottles   Culture   Final    NO GROWTH 3 DAYS Performed at [facility], [place] [address], [place], [place] [address]    Report Status PENDING  Incomplete  Blood Culture (routine x 2)     Status: None (Preliminary result)   Collection Time: 08/05/19  2:20 AM   Specimen: BLOOD  Result Value Ref Range Status   Specimen Description BLOOD RIGHT ARM  Final   Special Requests   Final    BOTTLES DRAWN AEROBIC AND ANAEROBIC Blood Culture results may not be optimal due to an inadequate volume of blood received in culture bottles   Culture   Final    NO GROWTH 3 DAYS Performed at [facility], [place] [address] [place], [place] [address]    Report Status PENDING  Incomplete  SARS Coronavirus 2 by RT PCR (hospital order, performed in [facility] hospital lab) Nasopharyngeal Nasopharyngeal Swab     Status: None   Collection Time: 08/05/19  2:32 AM   Specimen: Nasopharyngeal Swab  Result Value Ref Range Status   SARS Coronavirus 2 NEGATIVE NEGATIVE Final    Comment: (NOTE) If result is NEGATIVE SARS-CoV-2 target nucleic acids are NOT DETECTED. The SARS-CoV-2 RNA is generally detectable in upper and lower  respiratory specimens during the acute phase of infection. The lowest  concentration of SARS-CoV-2 viral copies this assay can detect is 250  copies / mL. A negative result does not preclude SARS-CoV-2 infection  and should not be used as the sole basis for treatment or other  patient management decisions.  A negative result may occur with  improper specimen collection / handling, submission of specimen other  than nasopharyngeal swab, presence of viral mutation(s) within  the  areas targeted by this assay, and inadequate number of viral copies  (<250 copies / mL). A negative result must be combined with clinical  observations, patient history, and epidemiological information. If result is POSITIVE SARS-CoV-2 target nucleic acids are DETECTED. The SARS-CoV-2 RNA is generally detectable in upper and lower  respiratory specimens dur ing the acute phase of infection.  Positive  results are indicative of active infection with SARS-CoV-2.  Clinical  correlation with patient history and other diagnostic information is  necessary to determine patient infection status.  Positive results do  not rule out bacterial infection or co-infection with other viruses. If result is PRESUMPTIVE POSTIVE SARS-CoV-2 nucleic acids MAY BE PRESENT.   A presumptive positive result was obtained on the submitted specimen  and confirmed on repeat testing.  While 2019 novel coronavirus  (SARS-CoV-2) nucleic acids may be present in the submitted sample  additional confirmatory testing may be necessary for epidemiological  and / or clinical management purposes  to differentiate between  SARS-CoV-2 and other Sarbecovirus currently known to infect humans.  If clinically indicated additional testing with an alternate test  methodology [phone]) is advised. The SARS-CoV-2 RNA is generally  detectable in upper and lower respiratory sp ecimens during the acute  phase of infection.  The expected result is Negative. Fact Sheet for Patients:  StrictlyIdeas.no Fact Sheet for Healthcare Providers: BankingDealers.co.za This test is not yet approved or cleared by the [place] FDA and has been authorized for detection and/or diagnosis of SARS-CoV-2 by FDA under an Emergency Use Authorization (EUA).  This EUA will remain in effect (meaning this test can be used) for the duration of the COVID-19 declaration under Section 564(b)(1) of the Act, 21  U.S.C. section 360bbb-3(b)(1), unless the authorization is terminated or revoked sooner. Performed at [facility], [place] [address] [place], [place] [address]   Respiratory Panel by PCR     Status: None   Collection Time: 08/05/19 11:00 AM   Specimen: Nasopharyngeal Swab; Respiratory  Result Value Ref Range Status   Adenovirus NOT DETECTED NOT DETECTED Final   Coronavirus 229E NOT DETECTED NOT DETECTED Final    Comment: (NOTE) The Coronavirus on the Respiratory Panel, DOES NOT test for the novel  Coronavirus (2019 nCoV)    Coronavirus HKU1 NOT DETECTED NOT DETECTED Final   Coronavirus NL63 NOT DETECTED NOT DETECTED Final   Coronavirus OC43 NOT DETECTED NOT DETECTED Final   Metapneumovirus NOT DETECTED NOT DETECTED Final   Rhinovirus / Enterovirus NOT DETECTED NOT DETECTED Final   Influenza A NOT DETECTED NOT DETECTED Final   Influenza B NOT DETECTED NOT DETECTED Final   Parainfluenza Virus 1 NOT DETECTED NOT DETECTED Final   Parainfluenza Virus 2 NOT DETECTED NOT DETECTED Final   Parainfluenza Virus 3 NOT DETECTED NOT DETECTED Final   Parainfluenza Virus 4 NOT DETECTED NOT DETECTED Final   Respiratory Syncytial Virus NOT DETECTED NOT DETECTED Final   Bordetella pertussis NOT DETECTED NOT DETECTED Final   Chlamydophila pneumoniae NOT DETECTED NOT DETECTED Final   Mycoplasma pneumoniae NOT DETECTED NOT DETECTED Final    Comment: Performed at [facility], [place] [address] [place], [place] [address]  Culture, Urine     Status: Abnormal   Collection Time: 08/05/19 12:45 PM   Specimen: Urine, Random  Result Value Ref Range Status   Specimen Description URINE, RANDOM  Final   Special Requests NONE  Final   Culture (A)  Final    <10,000 COLONIES/mL INSIGNIFICANT GROWTH Performed at [facility], [place] [address], [place], [place] [address]    Report Status 08/06/2019 FINAL  Final  SARS Coronavirus 2 by RT PCR (hospital order, performed in [facility] hospital lab)  Nasopharyngeal Nasopharyngeal Swab     Status: None   Collection Time: 08/06/19  5:00 AM   Specimen: Nasopharyngeal Swab  Result Value Ref Range Status   SARS Coronavirus 2 NEGATIVE NEGATIVE Final    Comment: (NOTE) If result is NEGATIVE SARS-CoV-2 target nucleic acids are NOT DETECTED. The SARS-CoV-2 RNA is generally detectable in upper and lower  respiratory specimens during the acute phase of infection. The lowest  concentration of SARS-CoV-2 viral copies this assay can detect is 250  copies / mL. A negative result does not preclude SARS-CoV-2 infection  and should not be used as the sole basis for treatment or other  patient management decisions.  A negative result may occur with  improper specimen collection / handling, submission of specimen other  than nasopharyngeal swab, presence of viral mutation(s) within the  areas targeted by this assay, and inadequate number of viral copies  (<250 copies / mL). A negative result must be combined with clinical  observations, patient history, and epidemiological information. If result is POSITIVE SARS-CoV-2 target  nucleic acids are DETECTED. The SARS-CoV-2 RNA is generally detectable in upper and lower  respiratory specimens dur ing the acute phase of infection.  Positive  results are indicative of active infection with SARS-CoV-2.  Clinical  correlation with patient history and other diagnostic information is  necessary to determine patient infection status.  Positive results do  not rule out bacterial infection or co-infection with other viruses. If result is PRESUMPTIVE POSTIVE SARS-CoV-2 nucleic acids MAY BE PRESENT.   A presumptive positive result was obtained on the submitted specimen  and confirmed on repeat testing.  While 2019 novel coronavirus  (SARS-CoV-2) nucleic acids may be present in the submitted sample  additional confirmatory testing may be necessary for epidemiological  and / or clinical management purposes  to  differentiate between  SARS-CoV-2 and other Sarbecovirus currently known to infect humans.  If clinically indicated additional testing with an alternate test  methodology [phone]) is advised. The SARS-CoV-2 RNA is generally  detectable in upper and lower respiratory sp ecimens during the acute  phase of infection. The expected result is Negative. Fact Sheet for Patients:  StrictlyIdeas.no Fact Sheet for Healthcare Providers: BankingDealers.co.za This test is not yet approved or cleared by the [place] FDA and has been authorized for detection and/or diagnosis of SARS-CoV-2 by FDA under an Emergency Use Authorization (EUA).  This EUA will remain in effect (meaning this test can be used) for the duration of the COVID-19 declaration under Section 564(b)(1) of the Act, 21 U.S.C. section 360bbb-3(b)(1), unless the authorization is terminated or revoked sooner. Performed at [facility], [place] [address] [place], [place] [address]      Labs: BNP (last 3 results) Recent Labs    08/05/19 0220  BNP 29.5   Basic Metabolic Panel: Recent Labs  Lab 08/05/19 0220 08/06/19 0444 08/07/19 0913 08/08/19 0813  NA 140 137 138 139  K 3.5 3.8 3.7 3.8  CL 105 103 105 105  CO2 21* 22 21* 25  GLUCOSE 224* 123* 111* 109*  BUN 20 26* 16 14  CREATININE 1.30* 1.23 1.09 1.12  CALCIUM 8.6* 8.1* 8.7* 9.0  MG  --  1.9  --   --   PHOS  --  3.0  --   --    Liver Function Tests: Recent Labs  Lab 08/05/19 0220 08/06/19 0444  AST 26 21  ALT 32 22  ALKPHOS 59 39  BILITOT 0.6 0.7  PROT 6.4* 5.9*  ALBUMIN 4.0 3.5   No results for input(s): LIPASE, AMYLASE in the last 168 hours. No results for input(s): AMMONIA in the last 168 hours. CBC: Recent Labs  Lab 08/05/19 0220 08/06/19 0444  WBC 11.1* 6.6  NEUTROABS 8.3* 3.6  HGB 14.5 12.4*  HCT 44.3 36.7*  MCV 97.8 96.3  PLT 452* 368   Cardiac Enzymes: No results for input(s):  CKTOTAL, CKMB, CKMBINDEX, TROPONINI in the last 168 hours. BNP: Invalid input(s): POCBNP CBG: No results for input(s): GLUCAP in the last 168 hours. D-Dimer Recent Labs    08/06/19 0444  DDIMER 4.42*   Hgb A1c No results for input(s): HGBA1C in the last 72 hours. Lipid Profile No results for input(s): CHOL, HDL, LDLCALC, TRIG, CHOLHDL, LDLDIRECT in the last 72 hours. Thyroid function studies No results for input(s): TSH, T4TOTAL, T3FREE, THYROIDAB in the last 72 hours.  Invalid input(s): FREET3 Anemia work up Recent Labs    08/06/19 0444  FERRITIN 61   Urinalysis    Component Value Date/Time  COLORURINE YELLOW 08/05/2019 [place] 08/05/2019 1255   LABSPEC 1.010 08/05/2019 1255   PHURINE 5.0 08/05/2019 [place] 08/05/2019 1255   [place] 08/05/2019 [place] 08/05/2019 1255   KETONESUR 15 (A) 08/05/2019 1255   PROTEINUR 30 (A) 08/05/2019 1255   NITRITE NEGATIVE 08/05/2019 1255   LEUKOCYTESUR NEGATIVE 08/05/2019 1255   Sepsis Labs Invalid input(s): PROCALCITONIN,  WBC,  LACTICIDVEN Microbiology Recent Results (from the past 240 hour(s))  Blood Culture (routine x 2)     Status: None (Preliminary result)   Collection Time: 08/05/19  2:15 AM   Specimen: BLOOD  Result Value Ref Range Status   Specimen Description BLOOD LEFT ARM  Final   Special Requests   Final    BOTTLES DRAWN AEROBIC AND ANAEROBIC Blood Culture results may not be optimal due to an inadequate volume of blood received in culture bottles   Culture   Final    NO GROWTH 3 DAYS Performed at [facility], [place] [address] [place], [place] [address]    Report Status PENDING  Incomplete  Blood Culture (routine x 2)     Status: None (Preliminary result)   Collection Time: 08/05/19  2:20 AM   Specimen: BLOOD  Result Value Ref Range Status   Specimen Description BLOOD RIGHT ARM  Final   Special Requests   Final    BOTTLES DRAWN AEROBIC AND  ANAEROBIC Blood Culture results may not be optimal due to an inadequate volume of blood received in culture bottles   Culture   Final    NO GROWTH 3 DAYS Performed at [facility], [place] [address], [place], [place] [address]    Report Status PENDING  Incomplete  SARS Coronavirus 2 by RT PCR (hospital order, performed in [facility] hospital lab) Nasopharyngeal Nasopharyngeal Swab     Status: None   Collection Time: 08/05/19  2:32 AM   Specimen: Nasopharyngeal Swab  Result Value Ref Range Status   SARS Coronavirus 2 NEGATIVE NEGATIVE Final    Comment: (NOTE) If result is NEGATIVE SARS-CoV-2 target nucleic acids are NOT DETECTED. The SARS-CoV-2 RNA is generally detectable in upper and lower  respiratory specimens during the acute phase of infection. The lowest  concentration of SARS-CoV-2 viral copies this assay can detect is 250  copies / mL. A negative result does not preclude SARS-CoV-2 infection  and should not be used as the sole basis for treatment or other  patient management decisions.  A negative result may occur with  improper specimen collection / handling, submission of specimen other  than nasopharyngeal swab, presence of viral mutation(s) within the  areas targeted by this assay, and inadequate number of viral copies  (<250 copies / mL). A negative result must be combined with clinical  observations, patient history, and epidemiological information. If result is POSITIVE SARS-CoV-2 target nucleic acids are DETECTED. The SARS-CoV-2 RNA is generally detectable in upper and lower  respiratory specimens dur ing the acute phase of infection.  Positive  results are indicative of active infection with SARS-CoV-2.  Clinical  correlation with patient history and other diagnostic information is  necessary to determine patient infection status.  Positive results do  not rule out bacterial infection or co-infection with other viruses. If result is PRESUMPTIVE  POSTIVE SARS-CoV-2 nucleic acids MAY BE PRESENT.   A presumptive positive result was obtained on the submitted specimen  and confirmed on repeat testing.  While 2019 novel  coronavirus  (SARS-CoV-2) nucleic acids may be present in the submitted sample  additional confirmatory testing may be necessary for epidemiological  and / or clinical management purposes  to differentiate between  SARS-CoV-2 and other Sarbecovirus currently known to infect humans.  If clinically indicated additional testing with an alternate test  methodology [phone]) is advised. The SARS-CoV-2 RNA is generally  detectable in upper and lower respiratory sp ecimens during the acute  phase of infection. The expected result is Negative. Fact Sheet for Patients:  StrictlyIdeas.no Fact Sheet for Healthcare Providers: BankingDealers.co.za This test is not yet approved or cleared by the [place] FDA and has been authorized for detection and/or diagnosis of SARS-CoV-2 by FDA under an Emergency Use Authorization (EUA).  This EUA will remain in effect (meaning this test can be used) for the duration of the COVID-19 declaration under Section 564(b)(1) of the Act, 21 U.S.C. section 360bbb-3(b)(1), unless the authorization is terminated or revoked sooner. Performed at [facility], [place] [address], [place], [place] [address]   Respiratory Panel by PCR     Status: None   Collection Time: 08/05/19 11:00 AM   Specimen: Nasopharyngeal Swab; Respiratory  Result Value Ref Range Status   Adenovirus NOT DETECTED NOT DETECTED Final   Coronavirus 229E NOT DETECTED NOT DETECTED Final    Comment: (NOTE) The Coronavirus on the Respiratory Panel, DOES NOT test for the novel  Coronavirus (2019 nCoV)    Coronavirus HKU1 NOT DETECTED NOT DETECTED Final   Coronavirus NL63 NOT DETECTED NOT DETECTED Final   Coronavirus OC43 NOT DETECTED NOT DETECTED Final   Metapneumovirus NOT  DETECTED NOT DETECTED Final   Rhinovirus / Enterovirus NOT DETECTED NOT DETECTED Final   Influenza A NOT DETECTED NOT DETECTED Final   Influenza B NOT DETECTED NOT DETECTED Final   Parainfluenza Virus 1 NOT DETECTED NOT DETECTED Final   Parainfluenza Virus 2 NOT DETECTED NOT DETECTED Final   Parainfluenza Virus 3 NOT DETECTED NOT DETECTED Final   Parainfluenza Virus 4 NOT DETECTED NOT DETECTED Final   Respiratory Syncytial Virus NOT DETECTED NOT DETECTED Final   Bordetella pertussis NOT DETECTED NOT DETECTED Final   Chlamydophila pneumoniae NOT DETECTED NOT DETECTED Final   Mycoplasma pneumoniae NOT DETECTED NOT DETECTED Final    Comment: Performed at [facility], [place] [address], [place], [place] [address]  Culture, Urine     Status: Abnormal   Collection Time: 08/05/19 12:45 PM   Specimen: Urine, Random  Result Value Ref Range Status   Specimen Description URINE, RANDOM  Final   Special Requests NONE  Final   Culture (A)  Final    <10,000 COLONIES/mL INSIGNIFICANT GROWTH Performed at [facility], [place] [address], [place], [place] [address]    Report Status 08/06/2019 FINAL  Final  SARS Coronavirus 2 by RT PCR (hospital order, performed in [facility] hospital lab) Nasopharyngeal Nasopharyngeal Swab     Status: None   Collection Time: 08/06/19  5:00 AM   Specimen: Nasopharyngeal Swab  Result Value Ref Range Status   SARS Coronavirus 2 NEGATIVE NEGATIVE Final    Comment: (NOTE) If result is NEGATIVE SARS-CoV-2 target nucleic acids are NOT DETECTED. The SARS-CoV-2 RNA is generally detectable in upper and lower  respiratory specimens during the acute phase of infection. The lowest  concentration of SARS-CoV-2 viral copies this assay can detect is 250  copies / mL. A negative result does not preclude SARS-CoV-2 infection  and should not be used  as the sole basis for treatment or other  patient management decisions.  A negative result may occur with  improper specimen  collection / handling, submission of specimen other  than nasopharyngeal swab, presence of viral mutation(s) within the  areas targeted by this assay, and inadequate number of viral copies  (<250 copies / mL). A negative result must be combined with clinical  observations, patient history, and epidemiological information. If result is POSITIVE SARS-CoV-2 target nucleic acids are DETECTED. The SARS-CoV-2 RNA is generally detectable in upper and lower  respiratory specimens dur ing the acute phase of infection.  Positive  results are indicative of active infection with SARS-CoV-2.  Clinical  correlation with patient history and other diagnostic information is  necessary to determine patient infection status.  Positive results do  not rule out bacterial infection or co-infection with other viruses. If result is PRESUMPTIVE POSTIVE SARS-CoV-2 nucleic acids MAY BE PRESENT.   A presumptive positive result was obtained on the submitted specimen  and confirmed on repeat testing.  While 2019 novel coronavirus  (SARS-CoV-2) nucleic acids may be present in the submitted sample  additional confirmatory testing may be necessary for epidemiological  and / or clinical management purposes  to differentiate between  SARS-CoV-2 and other Sarbecovirus currently known to infect humans.  If clinically indicated additional testing with an alternate test  methodology [phone]) is advised. The SARS-CoV-2 RNA is generally  detectable in upper and lower respiratory sp ecimens during the acute  phase of infection. The expected result is Negative. Fact Sheet for Patients:  StrictlyIdeas.no Fact Sheet for Healthcare Providers: BankingDealers.co.za This test is not yet approved or cleared by the [place] FDA and has been authorized for detection and/or diagnosis of SARS-CoV-2 by FDA under an Emergency Use Authorization (EUA).  This EUA will remain in effect  (meaning this test can be used) for the duration of the COVID-19 declaration under Section 564(b)(1) of the Act, 21 U.S.C. section 360bbb-3(b)(1), unless the authorization is terminated or revoked sooner. Performed at [facility], [place] [address] [place], [place] [address]      Time coordinating discharge: 40 minutes  SIGNED:   Elmarie Shiley, MD  [facility]

## 2019-08-11 LAB — CULTURE, BLOOD (ROUTINE X 2)
Culture: NO GROWTH
Culture: NO GROWTH

## 2019-08-25 ENCOUNTER — Telehealth: Payer: Self-pay

## 2019-08-25 NOTE — Telephone Encounter (Signed)
Notes on file from [place] sent to scheduling

## 2019-09-02 NOTE — Telephone Encounter (Signed)
NO NOTES JUST REFERRAL CALLED FOR NOTES

## 2019-09-05 ENCOUNTER — Other Ambulatory Visit: Payer: Self-pay

## 2019-09-05 ENCOUNTER — Ambulatory Visit: Payer: BC Managed Care – PPO | Admitting: Cardiovascular Disease

## 2019-09-05 ENCOUNTER — Encounter: Payer: Self-pay | Admitting: Cardiovascular Disease

## 2019-09-05 VITALS — BP 120/84 | HR 78 | Ht 69.0 in | Wt 200.1 lb

## 2019-09-05 DIAGNOSIS — E063 Autoimmune thyroiditis: Secondary | ICD-10-CM

## 2019-09-05 DIAGNOSIS — I5032 Chronic diastolic (congestive) heart failure: Secondary | ICD-10-CM | POA: Diagnosis not present

## 2019-09-05 DIAGNOSIS — E038 Other specified hypothyroidism: Secondary | ICD-10-CM

## 2019-09-05 NOTE — Progress Notes (Signed)
Cardiology Office Note:    Date:  09/05/2019   ID:  Eddie Ray, DOB Aug 11, 1976, MRN [id number]  PCP:  Redmond School, MD  Cardiologist:  Nahser  Electrophysiologist:  None   Referring MD: Cory Munch, PA-C   Chief Complaint  Patient presents with  . Congestive Heart Failure    Nov. 9, 2020    Eddie Ray is a 43 y.o. male with a hx of hypothyroidism and a recent hospitalization for shortness of breath.     hehad stopped his synthroid  ( ran out of synroid meds due to covid. )  Echocardiogram revealed normal left ventricular systolic function with an ejection fraction of 65 to 70%.  He has grade 1 diastolic dysfunction.  We are asked to see him today for further evaluation of this chronic diastolic congestive heart failure by Dr. Gerarda Fraction.   Feeling better now that he has restarted his synthroid Has started walking .    I have personally reviewed the echo images.  I think that any degree of diastolic dysfunction is probably very minimal.  Likely is also reversible.  He started walking since he left the hospital.  His weight is already down 2 pounds. He feels well when he is walking.   Past Medical History:  Diagnosis Date  . Body mass index (BMI) 29.0-29.9, adult   . Hypothyroidism   . Hypothyroidism due to Hashimoto's thyroiditis 03/16/2017  . Left ventricular diastolic dysfunction   . Lower extremity edema   . Nausea & vomiting 08/05/2019  . Overweight   . Severe acute respiratory syndrome   . Thyroiditis, lymphocytic     History reviewed. No pertinent surgical history.  Current Medications: Current Meds  Medication Sig  . furosemide (LASIX) 20 MG tablet Take 0.5 tablets (10 mg total) by mouth daily.  Marland Kitchen levothyroxine (SYNTHROID) 50 MCG tablet Take 1 tablet (50 mcg total) by mouth daily at 6 (six) AM.  . potassium chloride (KLOR-CON) 10 MEQ tablet Take 1 tablet (10 mEq total) by mouth daily.     Allergies:   Patient has no known allergies.   Social  History   Socioeconomic History  . Marital status: Married    Spouse name: Not on file  . Number of children: Not on file  . Years of education: Not on file  . Highest education level: Not on file  Occupational History  . Occupation: gas station  attendant  Social Needs  . Financial resource strain: Not on file  . Food insecurity    Worry: Not on file    Inability: Not on file  . Transportation needs    Medical: Not on file    Non-medical: Not on file  Tobacco Use  . Smoking status: Never Smoker  . Smokeless tobacco: Current User  Substance and Sexual Activity  . Alcohol use: Yes  . Drug use: No  . Sexual activity: Not on file    Comment: MARRIED  Lifestyle  . Physical activity    Days per week: Not on file    Minutes per session: Not on file  . Stress: Not on file  Relationships  . Social Herbalist on phone: Not on file    Gets together: Not on file    Attends religious service: Not on file    Active member of club or organization: Not on file    Attends meetings of clubs or organizations: Not on file    Relationship status: Not on file  Other  Topics Concern  . Not on file  Social History Narrative  . Not on file     Family History: The patient's family history includes Diabetes in his father and sister; Heart attack in his father.  ROS:   Please see the history of present illness.     All other systems reviewed and are negative.  EKGs/Labs/Other Studies Reviewed:    The following studies were reviewed today:   EKG:  Nov. 9, 2020   NSR at 2978 .   Mod. Voltage foro LVH  Recent Labs: 08/05/2019: B Natriuretic Peptide 17.1; TSH 10.279 08/06/2019: ALT 22; Hemoglobin 12.4; Magnesium 1.9; Platelets 368 08/08/2019: BUN 14; Creatinine, Ser 1.12; Potassium 3.8; Sodium 139  Recent Lipid Panel    Component Value Date/Time   TRIG 103 08/05/2019 0220    Physical Exam:    VS:  BP 120/84   Pulse 78   Ht 5\' 9"  (1.753 m)   Wt 200 lb 1.9 oz (90.8 kg)    SpO2 96%   BMI 29.55 kg/m     Wt Readings from Last 3 Encounters:  09/05/19 200 lb 1.9 oz (90.8 kg)  08/07/19 202 lb 6.1 oz (91.8 kg)  03/16/17 210 lb (95.3 kg)     GEN:  Well nourished, well developed in no acute distress HEENT: Normal NECK: No JVD; No carotid bruits LYMPHATICS: No lymphadenopathy CARDIAC: RRR, no murmurs, rubs, gallops RESPIRATORY:  Clear to auscultation without rales, wheezing or rhonchi  ABDOMEN: Soft, non-tender, non-distended MUSCULOSKELETAL:  No edema; No deformity  SKIN: Warm and dry NEUROLOGIC:  Alert and oriented x 3 PSYCHIATRIC:  Normal affect   ASSESSMENT:    1. Hypothyroidism due to Hashimoto's thyroiditis   2. Chronic diastolic heart failure (HCC)    PLAN:    In order of problems listed above:  1. Chronic diastolic dysfunction .  Patient was admitted to the hospital with shortness of breath and not feeling well after he had stopped taking his Synthroid for 4 months or so.  He had run out of his medications because he did not go to the doctor because of Covid.  Echocardiogram during hospitalization revealed mild diastolic dysfunction.  He has normal left ventricular systolic function.  He is back on his Synthroid and is feeling better.  He has been exercising on a regular basis.  I suspect that his heart function will improve and in fact might normalize after he is back on his Synthroid for a period of time.  At present he seems to be very well controlled on the low-dose potassium and Lasix.  For now continue the Lasix 10 mg a day and K-due .  We will check a basic metabolic profile today.  We will have him see my APP in 6 months and I will plan on seeing him again in 1 year.   Medication Adjustments/Labs and Tests Ordered: Current medicines are reviewed at length with the patient today.  Concerns regarding medicines are outlined above.  Orders Placed This Encounter  Procedures  . Basic Metabolic Panel (BMET)  . EKG 12-Lead   No orders of  the defined types were placed in this encounter.   Patient Instructions  Medication Instructions:  Your physician recommends that you continue on your current medications as directed. Please refer to the Current Medication list given to you today.  *If you need a refill on your cardiac medications before your next appointment, please call your pharmacy*  Lab Work: TODAY - basic metabolic panel If you  have labs (blood work) drawn today and your tests are completely normal, you will receive your results only by: Marland Kitchen MyChart Message (if you have MyChart) OR . A paper copy in the mail If you have any lab test that is abnormal or we need to change your treatment, we will call you to review the results.   Testing/Procedures: None Ordered   Follow-Up: At [facility], you and your health needs are our priority.  As part of our continuing mission to provide you with exceptional heart care, we have created designated Provider Care Teams.  These Care Teams include your primary Cardiologist (physician) and Advanced Practice Providers (APPs -  Physician Assistants and Nurse Practitioners) who all work together to provide you with the care you need, when you need it.  Your next appointment:   6 months  The format for your next appointment:   In Person  Provider:   Chelsea Aus, PA-C       Signed, Kristeen Miss, MD  09/05/2019 5:41 PM    [facility]

## 2019-09-05 NOTE — Patient Instructions (Signed)
Medication Instructions:  Your physician recommends that you continue on your current medications as directed. Please refer to the Current Medication list given to you today.  *If you need a refill on your cardiac medications before your next appointment, please call your pharmacy*  Lab Work: TODAY - basic metabolic panel If you have labs (blood work) drawn today and your tests are completely normal, you will receive your results only by: Marland Kitchen MyChart Message (if you have MyChart) OR . A paper copy in the mail If you have any lab test that is abnormal or we need to change your treatment, we will call you to review the results.   Testing/Procedures: None Ordered   Follow-Up: At [facility], you and your health needs are our priority.  As part of our continuing mission to provide you with exceptional heart care, we have created designated Provider Care Teams.  These Care Teams include your primary Cardiologist (physician) and Advanced Practice Providers (APPs -  Physician Assistants and Nurse Practitioners) who all work together to provide you with the care you need, when you need it.  Your next appointment:   6 months  The format for your next appointment:   In Person  Provider:   Robbie Lis, PA-C

## 2019-09-06 LAB — BASIC METABOLIC PANEL
BUN/Creatinine Ratio: 12 (ref 9–20)
BUN: 14 mg/dL (ref 6–24)
CO2: 22 mmol/L (ref 20–29)
Calcium: 9.8 mg/dL (ref 8.7–10.2)
Chloride: 107 mmol/L — ABNORMAL HIGH (ref 96–106)
Creatinine, Ser: 1.19 mg/dL (ref 0.76–1.27)
GFR calc Af Amer: 86 mL/min/{1.73_m2} (ref >59)
GFR calc non Af Amer: 74 mL/min/{1.73_m2} (ref >59)
Glucose: 99 mg/dL (ref 65–99)
Potassium: 4.2 mmol/L (ref 3.5–5.2)
Sodium: 144 mmol/L (ref 134–144)

## 2020-03-02 NOTE — Progress Notes (Signed)
Cardiology Office Note    Date:  03/05/2020   ID:  Eddie Ray, DOB 06-27-1976, MRN [id number]  PCP:  Elfredia Nevins, MD  Cardiologist: Dr. Elease Hashimoto   Chief Complaint: 6  Months follow up  History of Present Illness:   Eddie Ray is a 44 y.o. male with hx of hypothyoridism due to Hashimoto's thyroiditis and chronic diastolic CHF presents for follow up.   Admitted 07/2019 for SOB in setting of missed synthroid ( run out of medications due to COVID). Echo showed LVEF of 65-70% and garde 1 DD. Established care with Dr. Elease Hashimoto 08/2019. Improved dyspnea after synthroid restarted.   Here today for follow up.  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  No exercise due to work.  Denies chest pain, shortness of breath, orthopnea, PND, syncope, lower extremity edema or melena.  Now on thyroid medications.  Not checking his blood pressure.   Past Medical History:  Diagnosis Date  . Body mass index (BMI) 29.0-29.9, adult   . Hypothyroidism   . Hypothyroidism due to Hashimoto's thyroiditis 03/16/2017  . Left ventricular diastolic dysfunction   . Lower extremity edema   . Nausea & vomiting 08/05/2019  . Overweight   . Severe acute respiratory syndrome   . Thyroiditis, lymphocytic     History reviewed. No pertinent surgical history.  Current Medications: Prior to Admission medications   Medication Sig Start Date End Date Taking? Authorizing Provider  furosemide (LASIX) 20 MG tablet Take 0.5 tablets (10 mg total) by mouth daily. 08/08/19   Regalado, Belkys A, MD  levothyroxine (SYNTHROID) 50 MCG tablet Take 1 tablet (50 mcg total) by mouth daily at 6 (six) AM. 08/09/19   Regalado, Belkys A, MD  potassium chloride (KLOR-CON) 10 MEQ tablet Take 1 tablet (10 mEq total) by mouth daily. 08/08/19   Regalado, Prentiss Bells, MD    Allergies:   Patient has no known allergies.   Social History   Socioeconomic History  . Marital status: Married    Spouse name: Not on  file  . Number of children: Not on file  . Years of education: Not on file  . Highest education level: Not on file  Occupational History  . Occupation: gas station  attendant  Tobacco Use  . Smoking status: Never Smoker  . Smokeless tobacco: Current User  Substance and Sexual Activity  . Alcohol use: Yes  . Drug use: No  . Sexual activity: Not on file    Comment: MARRIED  Other Topics Concern  . Not on file  Social History Narrative  . Not on file   Social Determinants of Health   Financial Resource Strain:   . Difficulty of Paying Living Expenses:   Food Insecurity:   . Worried About Programme researcher, broadcasting/film/video in the Last Year:   . Barista in the Last Year:   Transportation Needs:   . Freight forwarder (Medical):   Marland Kitchen Lack of Transportation (Non-Medical):   Physical Activity:   . Days of Exercise per Week:   . Minutes of Exercise per Session:   Stress:   . Feeling of Stress :   Social Connections:   . Frequency of Communication with Friends and Family:   . Frequency of Social Gatherings with Friends and Family:   . Attends Religious Services:   . Active Member of Clubs or Organizations:   . Attends Banker Meetings:   Marland Kitchen Marital Status:  Family History:  The patient's family history includes Diabetes in his father and sister; Heart attack in his father.   ROS:   Please see the history of present illness.    ROS All other systems reviewed and are negative.   PHYSICAL EXAM:   VS:  BP (!) 136/94   Pulse 68   Ht 5\' 8"  (1.727 m)   Wt 200 lb 12.8 oz (91.1 kg)   SpO2 98%   BMI 30.53 kg/m    GEN: Well nourished, well developed, in no acute distress  HEENT: normal  Neck: no JVD, carotid bruits, or masses Cardiac: RRR; no murmurs, rubs, or gallops,no edema  Respiratory:  clear to auscultation bilaterally, normal work of breathing GI: soft, nontender, nondistended, + BS MS: no deformity or atrophy  Skin: warm and dry, no rash Neuro:  Alert  and Oriented x 3, Strength and sensation are intact Psych: euthymic mood, full affect  Wt Readings from Last 3 Encounters:  03/05/20 200 lb 12.8 oz (91.1 kg)  09/05/19 200 lb 1.9 oz (90.8 kg)  08/07/19 202 lb 6.1 oz (91.8 kg)      Studies/Labs Reviewed:   EKG:  EKG is not ordered today.   Recent Labs: 08/05/2019: B Natriuretic Peptide 17.1; TSH 10.279 08/06/2019: ALT 22; Hemoglobin 12.4; Magnesium 1.9; Platelets 368 09/05/2019: BUN 14; Creatinine, Ser 1.19; Potassium 4.2; Sodium 144   Lipid Panel    Component Value Date/Time   TRIG 103 08/05/2019 0220    Additional studies/ records that were reviewed today include:   Echocardiogram: 07/2019   1. Left ventricular ejection fraction, by visual estimation, is 65 to  70%. The left ventricle has hyperdynamic function. Normal left ventricular  size. There is no left ventricular hypertrophy.  2. Left ventricular diastolic Doppler parameters are consistent with  impaired relaxation pattern of LV diastolic filling.  3. Global right ventricle has normal systolic function.The right  ventricular size is normal. No increase in right ventricular wall  thickness.  4. Left atrial size was normal.  5. Right atrial size was normal.  6. The mitral valve is normal in structure. No evidence of mitral valve  regurgitation. No evidence of mitral stenosis.  7. The tricuspid valve is normal in structure. Tricuspid valve  regurgitation was not visualized by color flow Doppler.  8. The aortic valve is normal in structure. Aortic valve regurgitation  was not visualized by color flow Doppler. Structurally normal aortic  valve, with no evidence of sclerosis or stenosis.  9. The pulmonic valve was normal in structure. Pulmonic valve  regurgitation is not visualized by color flow Doppler.  10. The inferior vena cava is normal in size with greater than 50%  respiratory variability, suggesting right atrial pressure of 3 mmHg.        ASSESSMENT & PLAN:   1.  Chronic diastolic heart failure 2.  Elevated blood pressure without hypertension 3.  Hypothyroidism  Patient has busy work schedule.  I have educated him regarding lifestyle changes.  Reviewed blood pressure goal and heart failure in detail.  He will work on diet and exercise prior to consideration of medication.  Seems motivated.  He will get a blood pressure cuff and keep track of his blood pressure.  Discussed low-sodium diet.    Medication Adjustments/Labs and Tests Ordered: Current medicines are reviewed at length with the patient today.  Concerns regarding medicines are outlined above.  Medication changes, Labs and Tests ordered today are listed in the Patient Instructions below. Patient  Instructions  Medication Instructions:  Your physician recommends that you continue on your current medications as directed. Please refer to the Current Medication list given to you today.  *If you need a refill on your cardiac medications before your next appointment, please call your pharmacy*   Lab Work: None ordered  If you have labs (blood work) drawn today and your tests are completely normal, you will receive your results only by: Marland Kitchen MyChart Message (if you have MyChart) OR . A paper copy in the mail If you have any lab test that is abnormal or we need to change your treatment, we will call you to review the results.   Testing/Procedures: None ordered   Follow-Up: At [facility], you and your health needs are our priority.  As part of our continuing mission to provide you with exceptional heart care, we have created designated Provider Care Teams.  These Care Teams include your primary Cardiologist (physician) and Advanced Practice Providers (APPs -  Physician Assistants and Nurse Practitioners) who all work together to provide you with the care you need, when you need it.  We recommend signing up for the patient portal called "MyChart".  Sign up  information is provided on this After Visit Summary.  MyChart is used to connect with patients for Virtual Visits (Telemedicine).  Patients are able to view lab/test results, encounter notes, upcoming appointments, etc.  Non-urgent messages can be sent to your provider as well.   To learn more about what you can do with MyChart, go to [email or web address].    Your next appointment:   6 month(s)  The format for your next appointment:   In Person  Provider:   You may see Dr. Kristeen Miss or one of the following Advanced Practice Providers on your designated Care Team:    Tereso Newcomer, PA-C  Chelsea Aus, PA-C    Other Instructions      Lorelei Pont, [place]  03/05/2020 9:45 AM    [facility] [address] [place], [place], [place]  [address] Phone: [phone]; Fax: [phone]

## 2020-03-05 ENCOUNTER — Ambulatory Visit (INDEPENDENT_AMBULATORY_CARE_PROVIDER_SITE_OTHER): Payer: 59 | Admitting: Physician Assistant

## 2020-03-05 ENCOUNTER — Encounter: Payer: Self-pay | Admitting: Physician Assistant

## 2020-03-05 ENCOUNTER — Other Ambulatory Visit: Payer: Self-pay

## 2020-03-05 VITALS — BP 136/94 | HR 68 | Ht 68.0 in | Wt 200.8 lb

## 2020-03-05 DIAGNOSIS — R030 Elevated blood-pressure reading, without diagnosis of hypertension: Secondary | ICD-10-CM

## 2020-03-05 DIAGNOSIS — I5032 Chronic diastolic (congestive) heart failure: Secondary | ICD-10-CM

## 2020-03-05 DIAGNOSIS — E063 Autoimmune thyroiditis: Secondary | ICD-10-CM | POA: Diagnosis not present

## 2020-03-05 DIAGNOSIS — E038 Other specified hypothyroidism: Secondary | ICD-10-CM | POA: Diagnosis not present

## 2020-03-05 NOTE — Patient Instructions (Addendum)
Medication Instructions:  Your physician recommends that you continue on your current medications as directed. Please refer to the Current Medication list given to you today.  *If you need a refill on your cardiac medications before your next appointment, please call your pharmacy*   Lab Work: None ordered  If you have labs (blood work) drawn today and your tests are completely normal, you will receive your results only by: Marland Kitchen MyChart Message (if you have MyChart) OR . A paper copy in the mail If you have any lab test that is abnormal or we need to change your treatment, we will call you to review the results.   Testing/Procedures: None ordered   Follow-Up: At [facility], you and your health needs are our priority.  As part of our continuing mission to provide you with exceptional heart care, we have created designated Provider Care Teams.  These Care Teams include your primary Cardiologist (physician) and Advanced Practice Providers (APPs -  Physician Assistants and Nurse Practitioners) who all work together to provide you with the care you need, when you need it.  We recommend signing up for the patient portal called "MyChart".  Sign up information is provided on this After Visit Summary.  MyChart is used to connect with patients for Virtual Visits (Telemedicine).  Patients are able to view lab/test results, encounter notes, upcoming appointments, etc.  Non-urgent messages can be sent to your provider as well.   To learn more about what you can do with MyChart, go to [email or web address].    Your next appointment:   6 month(s)  The format for your next appointment:   In Person  Provider:   You may see Dr. Kristeen Miss or one of the following Advanced Practice Providers on your designated Care Team:    Tereso Newcomer, PA-C  Chelsea Aus, [place]    Other Instructions

## 2020-08-07 ENCOUNTER — Ambulatory Visit (HOSPITAL_COMMUNITY)
Admission: RE | Admit: 2020-08-07 | Discharge: 2020-08-07 | Disposition: A | Discharge status: Home / Self Care | Payer: 59 | Source: Ambulatory Visit | Attending: Physician Assistant | Admitting: Physician Assistant

## 2020-08-07 ENCOUNTER — Other Ambulatory Visit: Payer: Self-pay

## 2020-08-07 ENCOUNTER — Other Ambulatory Visit (HOSPITAL_COMMUNITY): Payer: Self-pay | Admitting: Physician Assistant

## 2020-08-07 DIAGNOSIS — R0789 Other chest pain: Secondary | ICD-10-CM | POA: Diagnosis not present

## 2020-08-07 DIAGNOSIS — R058 Other specified cough: Secondary | ICD-10-CM | POA: Diagnosis present

## 2020-12-27 ENCOUNTER — Emergency Department (HOSPITAL_COMMUNITY)
Admission: EM | Admit: 2020-12-27 | Discharge: 2020-12-27 | Disposition: A | Discharge status: Home / Self Care | Payer: 59 | Attending: Emergency Medicine | Admitting: Emergency Medicine

## 2020-12-27 ENCOUNTER — Emergency Department (HOSPITAL_COMMUNITY): Payer: 59

## 2020-12-27 ENCOUNTER — Encounter (HOSPITAL_COMMUNITY): Payer: Self-pay | Admitting: Emergency Medicine

## 2020-12-27 ENCOUNTER — Other Ambulatory Visit: Payer: Self-pay

## 2020-12-27 DIAGNOSIS — I5032 Chronic diastolic (congestive) heart failure: Secondary | ICD-10-CM | POA: Diagnosis not present

## 2020-12-27 DIAGNOSIS — Z79899 Other long term (current) drug therapy: Secondary | ICD-10-CM | POA: Insufficient documentation

## 2020-12-27 DIAGNOSIS — J9801 Acute bronchospasm: Secondary | ICD-10-CM | POA: Diagnosis not present

## 2020-12-27 DIAGNOSIS — J45909 Unspecified asthma, uncomplicated: Secondary | ICD-10-CM | POA: Insufficient documentation

## 2020-12-27 DIAGNOSIS — E038 Other specified hypothyroidism: Secondary | ICD-10-CM | POA: Diagnosis not present

## 2020-12-27 DIAGNOSIS — Z20822 Contact with and (suspected) exposure to covid-19: Secondary | ICD-10-CM | POA: Insufficient documentation

## 2020-12-27 DIAGNOSIS — R0602 Shortness of breath: Secondary | ICD-10-CM | POA: Diagnosis present

## 2020-12-27 LAB — HEPATIC FUNCTION PANEL
ALT: 29 U/L (ref 0–44)
AST: 24 U/L (ref 15–41)
Albumin: 4.4 g/dL (ref 3.5–5.0)
Alkaline Phosphatase: 64 U/L (ref 38–126)
Bilirubin, Direct: 0.1 mg/dL (ref 0.0–0.2)
Indirect Bilirubin: 0.6 mg/dL (ref 0.3–0.9)
Total Bilirubin: 0.7 mg/dL (ref 0.3–1.2)
Total Protein: 7.4 g/dL (ref 6.5–8.1)

## 2020-12-27 LAB — CBC WITH DIFFERENTIAL/PLATELET
Abs Immature Granulocytes: 0.03 10*3/uL (ref 0.00–0.07)
Basophils Absolute: 0 10*3/uL (ref 0.0–0.1)
Basophils Relative: 0 %
Eosinophils Absolute: 0.8 10*3/uL — ABNORMAL HIGH (ref 0.0–0.5)
Eosinophils Relative: 9 %
HCT: 45.8 % (ref 39.0–52.0)
Hemoglobin: 14.9 g/dL (ref 13.0–17.0)
Immature Granulocytes: 0 %
Lymphocytes Relative: 35 %
Lymphs Abs: 3.1 10*3/uL (ref 0.7–4.0)
MCH: 31.6 pg (ref 26.0–34.0)
MCHC: 32.5 g/dL (ref 30.0–36.0)
MCV: 97 fL (ref 80.0–100.0)
Monocytes Absolute: 0.4 10*3/uL (ref 0.1–1.0)
Monocytes Relative: 5 %
Neutro Abs: 4.6 10*3/uL (ref 1.7–7.7)
Neutrophils Relative %: 51 %
Platelets: 440 10*3/uL — ABNORMAL HIGH (ref 150–400)
RBC: 4.72 MIL/uL (ref 4.22–5.81)
RDW: 12.3 % (ref 11.5–15.5)
WBC: 9 10*3/uL (ref 4.0–10.5)
nRBC: 0 % (ref 0.0–0.2)

## 2020-12-27 LAB — TROPONIN I (HIGH SENSITIVITY): Troponin I (High Sensitivity): 3 ng/L (ref <18)

## 2020-12-27 LAB — BASIC METABOLIC PANEL
Anion gap: 12 (ref 5–15)
BUN: 16 mg/dL (ref 6–20)
CO2: 24 mmol/L (ref 22–32)
Calcium: 8.7 mg/dL — ABNORMAL LOW (ref 8.9–10.3)
Chloride: 103 mmol/L (ref 98–111)
Creatinine, Ser: 1.07 mg/dL (ref 0.61–1.24)
GFR, Estimated: >60 mL/min (ref >60)
Glucose, Bld: 101 mg/dL — ABNORMAL HIGH (ref 70–99)
Potassium: 3.4 mmol/L — ABNORMAL LOW (ref 3.5–5.1)
Sodium: 139 mmol/L (ref 135–145)

## 2020-12-27 LAB — BRAIN NATRIURETIC PEPTIDE: B Natriuretic Peptide: 31 pg/mL (ref 0.0–100.0)

## 2020-12-27 LAB — RESP PANEL BY RT-PCR (FLU A&B, COVID) ARPGX2
Influenza A by PCR: NEGATIVE
Influenza B by PCR: NEGATIVE
SARS Coronavirus 2 by RT PCR: NEGATIVE

## 2020-12-27 LAB — POC SARS CORONAVIRUS 2 AG -  ED: SARS Coronavirus 2 Ag: NEGATIVE

## 2020-12-27 MED ORDER — FUROSEMIDE 10 MG/ML IJ SOLN
20.0000 mg | Freq: Once | INTRAMUSCULAR | Status: AC
  Administered 2020-12-27: 20 mg via INTRAVENOUS
  Filled 2020-12-27: qty 2

## 2020-12-27 MED ORDER — NITROGLYCERIN IN D5W 200-5 MCG/ML-% IV SOLN
5.0000 ug/min | INTRAVENOUS | Status: DC
  Administered 2020-12-27: 5 ug/min via INTRAVENOUS
  Filled 2020-12-27: qty 250

## 2020-12-27 MED ORDER — METHYLPREDNISOLONE SODIUM SUCC 125 MG IJ SOLR
125.0000 mg | Freq: Once | INTRAMUSCULAR | Status: AC
  Administered 2020-12-27: 125 mg via INTRAVENOUS
  Filled 2020-12-27: qty 2

## 2020-12-27 MED ORDER — PREDNISONE 50 MG PO TABS: ORAL_TABLET | ORAL | 0 refills | Status: DC

## 2020-12-27 MED ORDER — IPRATROPIUM-ALBUTEROL 0.5-2.5 (3) MG/3ML IN SOLN: 3.0000 mL | Freq: Once | RESPIRATORY_TRACT | Status: DC

## 2020-12-27 MED ORDER — ALBUTEROL SULFATE HFA 108 (90 BASE) MCG/ACT IN AERS
4.0000 | INHALATION_SPRAY | RESPIRATORY_TRACT | Status: DC | PRN
  Administered 2020-12-27: 4 via RESPIRATORY_TRACT
  Filled 2020-12-27: qty 6.7

## 2020-12-27 NOTE — ED Triage Notes (Signed)
Pt c/o sob that started 5 days ago. Pt oxygen 87% RA.

## 2020-12-27 NOTE — Discharge Instructions (Addendum)
Use the inhaler given, taking 2 puffs every 4 hours if you are wheezing or shortness of breath returns.  Take your next dose of prednisone tomorrow evening with your evening meal.  Return here for any worsening symptoms.  I do recommend an office visit with a pulmonologist for further evaluation of this new onset of wheezing.  Call the number listed above.  I also suggest looking for Coricidin brand products to help you with your nasal congestion, look for a formula that treats congestion or is listed as a decongestant.  This brand will not interfere with blood pressure.

## 2020-12-27 NOTE — ED Notes (Signed)
EDP at bedside  

## 2020-12-27 NOTE — ED Notes (Signed)
Pt states he feels much better after inhaler. Pt work of breathing improved. O2 discontinued at this time. EDP updated.

## 2020-12-27 NOTE — ED Notes (Signed)
Lab called and informed blood work was dropped off at 1540, still not showing as received in Epic.

## 2020-12-28 NOTE — ED Provider Notes (Signed)
[facility] EMERGENCY DEPARTMENT Provider Note   CSN: [id number] Arrival date & time: 12/27/20  1512     History Chief Complaint  Patient presents with  . Shortness of Breath    Eddie Ray is a 45 y.o. male with a history of CHF, LV diastolic dysfunction with prior history of acute respiratory failure and admission for this problem, also with history of childhood asthma,  Presenting with a 4 day history of worsening shortness of breath in association with wheezing, worsening every evening.  He denies fevers, chills, cough, peripheral edema, also denies orthopnea, chest pain, palpitations, n/v.  Reports significant bilateral nasal congestion, but no rhinorrhea or seasonal allergy like sx.     Wife at bedside reports he has had similar episodes since his CHF admission which improved with steroids.  He has also been using her albuterol inhaler which tends to help his symptoms. The history is provided by the patient and the spouse.       Past Medical History:  Diagnosis Date  . Body mass index (BMI) 29.0-29.9, adult   . Hypothyroidism   . Hypothyroidism due to Hashimoto's thyroiditis 03/16/2017  . Left ventricular diastolic dysfunction   . Lower extremity edema   . Nausea & vomiting 08/05/2019  . Overweight   . Severe acute respiratory syndrome   . Thyroiditis, lymphocytic     Patient Active Problem List   Diagnosis Date Noted  . Chronic diastolic CHF (congestive heart failure) (HCC) 09/05/2019  . Acute respiratory failure with hypoxia (HCC) 08/05/2019  . Nausea & vomiting 08/05/2019  . Hypothyroidism due to Hashimoto's thyroiditis 03/16/2017    History reviewed. No pertinent surgical history.     Family History  Problem Relation Age of Onset  . Diabetes Father   . Heart attack Father   . Diabetes Sister     Social History   Tobacco Use  . Smoking status: Never Smoker  . Smokeless tobacco: Current User  Vaping Use  . Vaping Use: Never used  Substance Use  Topics  . Alcohol use: Yes  . Drug use: No    Home Medications Prior to Admission medications   Medication Sig Start Date End Date Taking? Authorizing Provider  predniSONE (DELTASONE) 50 MG tablet Take 1 tablet daily for 5 days 12/27/20  Yes Idol, Raynelle Fanning, PA-C  levothyroxine (SYNTHROID) 50 MCG tablet Take 1 tablet (50 mcg total) by mouth daily at 6 (six) AM. 08/09/19   Regalado, Prentiss Bells, MD    Allergies    Patient has no known allergies.  Review of Systems   Review of Systems  Constitutional: Negative for chills and fever.  HENT: Positive for congestion. Negative for sore throat.   Eyes: Negative.   Respiratory: Positive for shortness of breath and wheezing. Negative for chest tightness.   Cardiovascular: Negative for chest pain, palpitations and leg swelling.  Gastrointestinal: Negative for abdominal pain, nausea and vomiting.  Genitourinary: Negative.   Musculoskeletal: Negative for arthralgias, joint swelling and neck pain.  Skin: Negative.  Negative for rash and wound.  Neurological: Negative for dizziness, weakness, light-headedness, numbness and headaches.  Psychiatric/Behavioral: Negative.   All other systems reviewed and are negative.   Physical Exam Updated Vital Signs BP (!) 142/99   Pulse 74   Temp 97.6 F (36.4 C) (Oral)   Resp 18   Ht 5\' 8"  (1.727 m)   Wt 92 kg   SpO2 94%   BMI 30.84 kg/m   Physical Exam Vitals and nursing note reviewed.  Constitutional:      Appearance: He is well-developed and well-nourished.  HENT:     Head: Normocephalic and atraumatic.  Eyes:     Conjunctiva/sclera: Conjunctivae normal.  Cardiovascular:     Rate and Rhythm: Normal rate and regular rhythm.     Pulses: Intact distal pulses.     Heart sounds: Normal heart sounds.  Pulmonary:     Effort: Pulmonary effort is normal.     Breath sounds: Decreased breath sounds and wheezing present. No rhonchi or rales.     Comments: Expiratory wheeze bilateral lung fields with  prolonged expirations.  No rales or rhonchi. Abdominal:     General: Bowel sounds are normal.     Palpations: Abdomen is soft.     Tenderness: There is no abdominal tenderness.  Musculoskeletal:        General: Normal range of motion.     Cervical back: Normal range of motion.     Right lower leg: No edema.     Left lower leg: No edema.  Skin:    General: Skin is warm and dry.  Neurological:     Mental Status: He is alert.  Psychiatric:        Mood and Affect: Mood and affect normal.     ED Results / Procedures / Treatments   Labs (all labs ordered are listed, but only abnormal results are displayed) Labs Reviewed  CBC WITH DIFFERENTIAL/PLATELET - Abnormal; Notable for the following components:      Result Value   Platelets 440 (*)    Eosinophils Absolute 0.8 (*)    All other components within normal limits  BASIC METABOLIC PANEL - Abnormal; Notable for the following components:   Potassium 3.4 (*)    Glucose, Bld 101 (*)    Calcium 8.7 (*)    All other components within normal limits  RESP PANEL BY RT-PCR (FLU A&B, COVID) ARPGX2  BRAIN NATRIURETIC PEPTIDE  HEPATIC FUNCTION PANEL  POC SARS CORONAVIRUS 2 AG -  ED  TROPONIN I (HIGH SENSITIVITY)    EKG EKG Interpretation  Date/Time:  Thursday December 27 2020 17:51:11 EST Ventricular Rate:  98 PR Interval:    QRS Duration: 68 QT Interval:  365 QTC Calculation: 466 R Axis:   -32 Text Interpretation: Sinus rhythm Left axis deviation Borderline low voltage, extremity leads Abnormal R-wave progression, early transition Nonspecific T abnormalities, lateral leads Baseline wander in lead(s) V1 Confirmed by Lockie Mola, Adam ([id number]) on 12/28/2020 9:43:59 AM   Radiology No results found.  Procedures Procedures   Medications Ordered in ED Medications  methylPREDNISolone sodium succinate (SOLU-MEDROL) 125 mg/2 mL injection 125 mg (125 mg Intravenous Given 12/27/20 1608)  furosemide (LASIX) injection 20 mg (20 mg Intravenous Given  12/27/20 1617)    ED Course  I have reviewed the triage vital signs and the nursing notes.  Pertinent labs & imaging results that were available during my care of the patient were reviewed by me and considered in my medical decision making (see chart for details).    MDM Rules/Calculators/A&P                          Pt's labs, imaging and ekg reviewed.  No evidence of CHF or ACS as source of sx. Initally given small dose of lasix given hx, also given solumedrol per IV along with albuterol giving bronchospasm/wheezing on exam.  Sx completely responded, wheezing and sob resolved with this tx.  Normal range bnp,  clear cxr, no evidence for CHF, exam favoring bronchospasm/return of asthma, especially given history of multiple courses of steroids recently for control of bronchospasm. He was sx free at time of dc.  He will go home with albuterol mdi and prednisone pulse dosing.  Ambulated without drop in O2 saturation prior to dc home.  Strongly encouraged outpt pulmonary consult which has been referred to our local [facility] - pt agreeable.  Strict return precautions discussed. Final Clinical Impression(s) / ED Diagnoses Final diagnoses:  Shortness of breath  Bronchospasm    Rx / DC Orders ED Discharge Orders         Ordered    predniSONE (DELTASONE) 50 MG tablet        12/27/20 2033           Burgess Amor, PA-C 12/31/20 1140    Bethann Berkshire, MD 01/01/21 2309

## 2021-01-14 IMAGING — CT CT ANGIO CHEST
2 of 7 series · 19 of 46 positions shown · IV contrast (omnipaque)
Comparison: None.

CLINICAL DATA: Shortness of breath and cough and abdominal pain.
Pulmonary embolism suspected

EXAM:
CT ANGIOGRAPHY CHEST WITH CONTRAST
TECHNIQUE: Multidetector CT imaging of the chest was performed using the
standard protocol during bolus administration of intravenous
contrast. Multiplanar CT image reconstructions and MIPs were
obtained to evaluate the vascular anatomy.
CONTRAST:  100mL OMNIPAQUE IOHEXOL 350 MG/ML SOLN

[Series 7: thins · axial · 0.81mm/px · z∈[-211,+63]mm · 16 of 441 slices shown]
[im 25/441  lung]
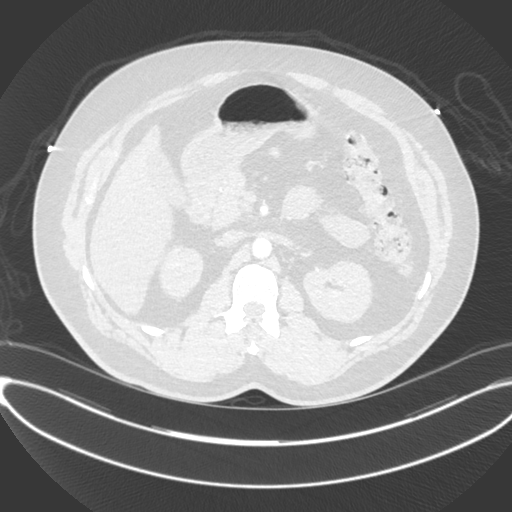
[im 49/441  soft-tissue]
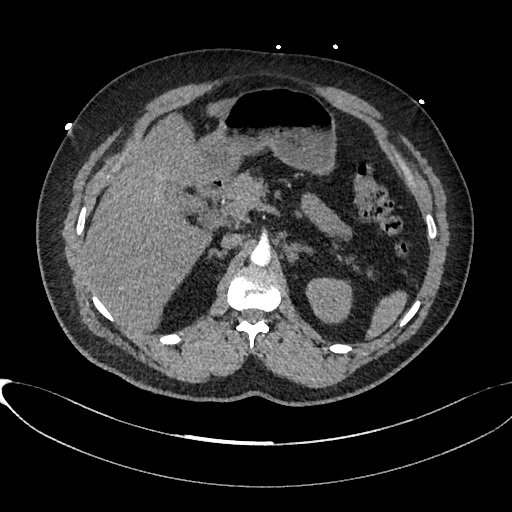
[im 74/441  lung]
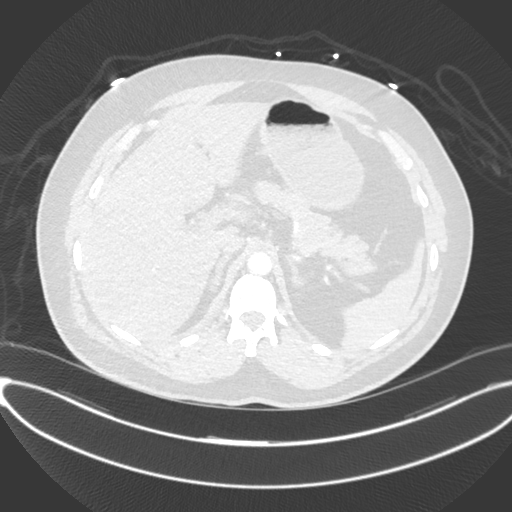
[im 98/441  soft-tissue]
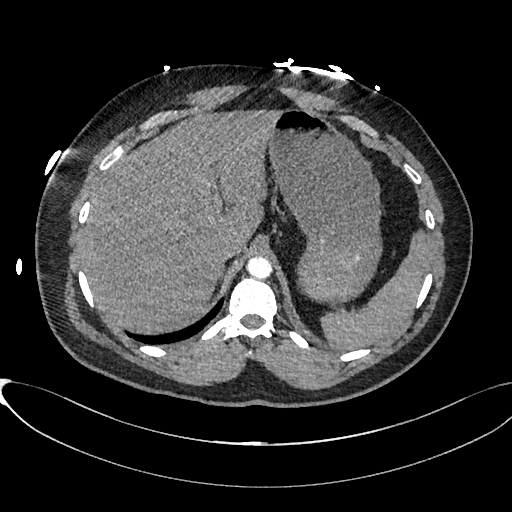
[im 123/441  lung]
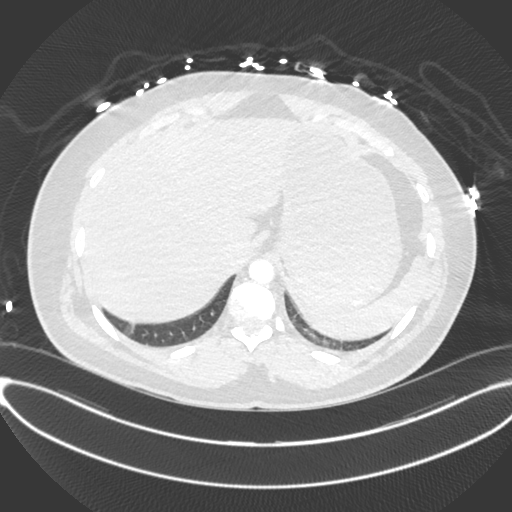
[im 147/441  soft-tissue]
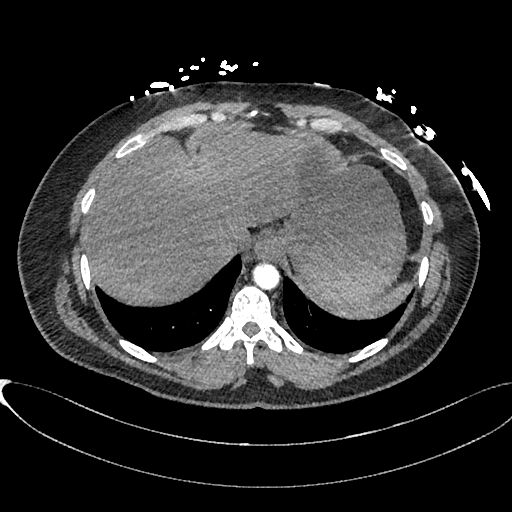
[im 172/441  lung]
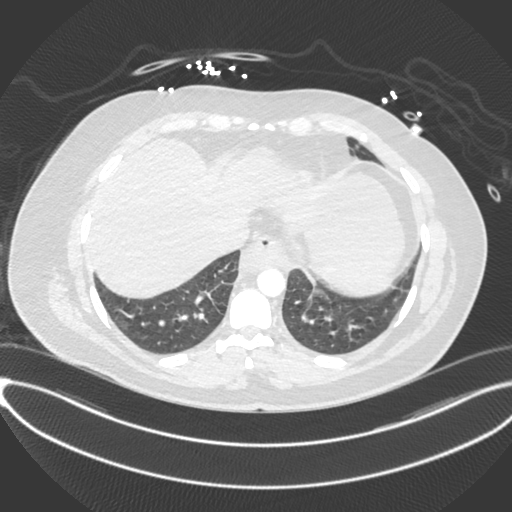
[im 196/441  soft-tissue]
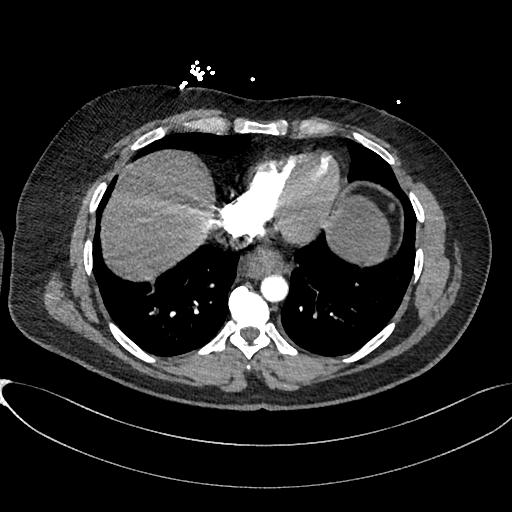
[im 245/441  lung]
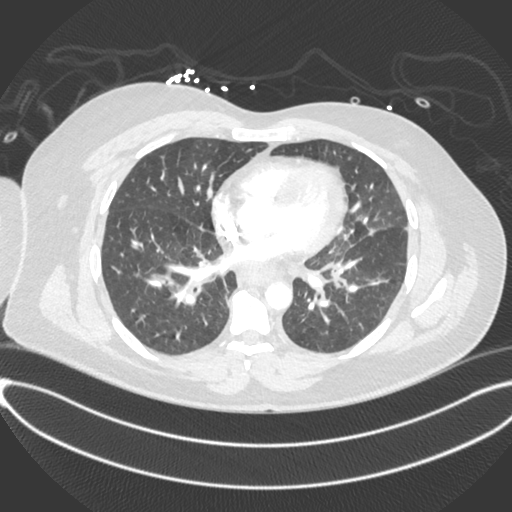
[im 269/441  soft-tissue]
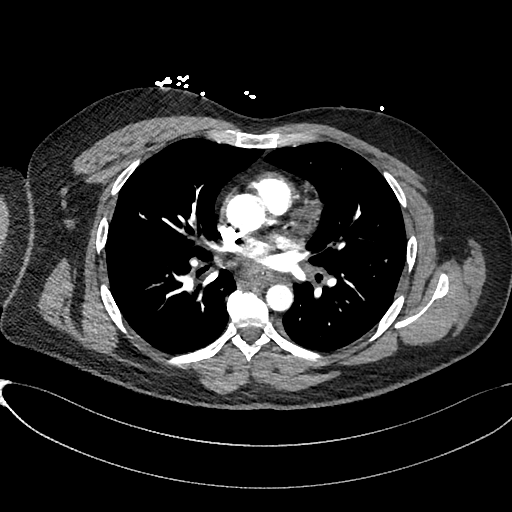
[im 294/441  lung]
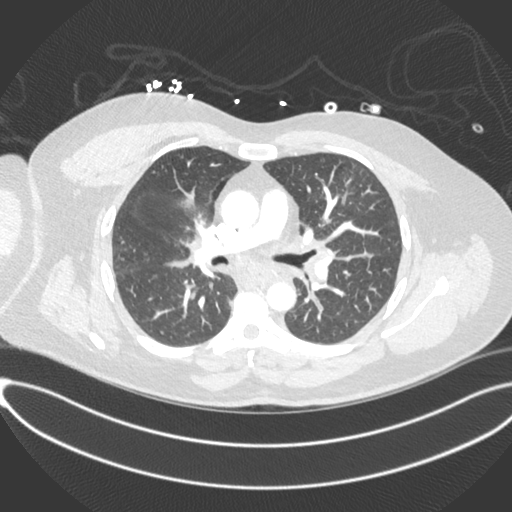
[im 318/441  soft-tissue]
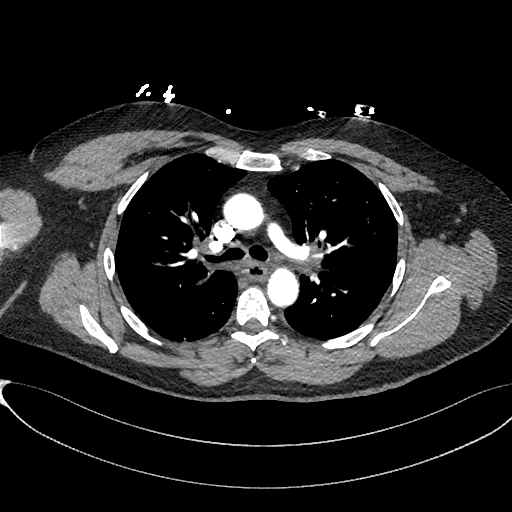
[im 343/441  lung]
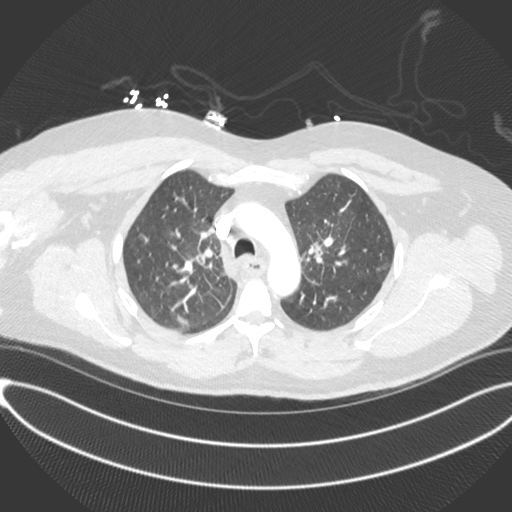
[im 367/441  soft-tissue]
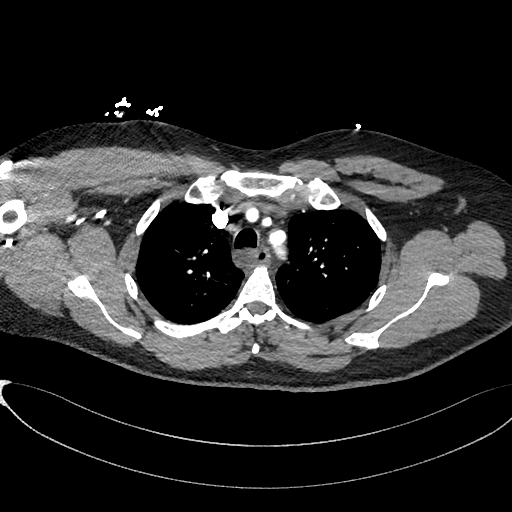
[im 392/441  lung]
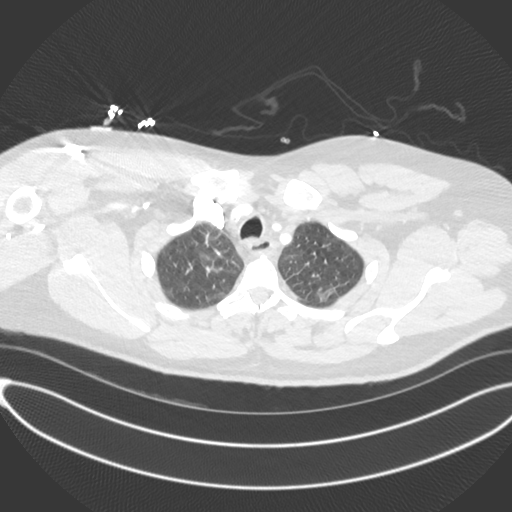
[im 416/441  soft-tissue]
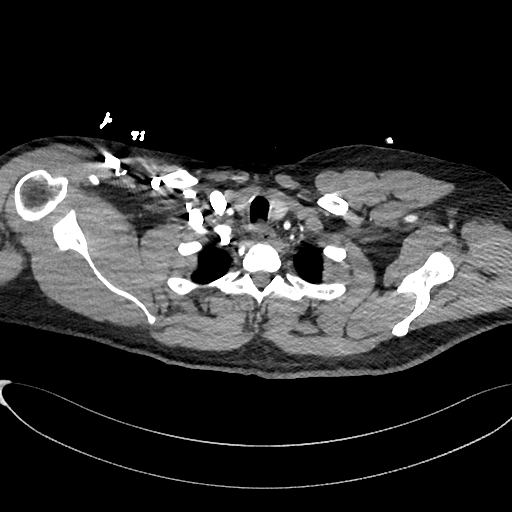

[Series 8: cor · coronal · 0.62mm/px · 3 of 151 slices shown]
[im 38/151  soft-tissue]
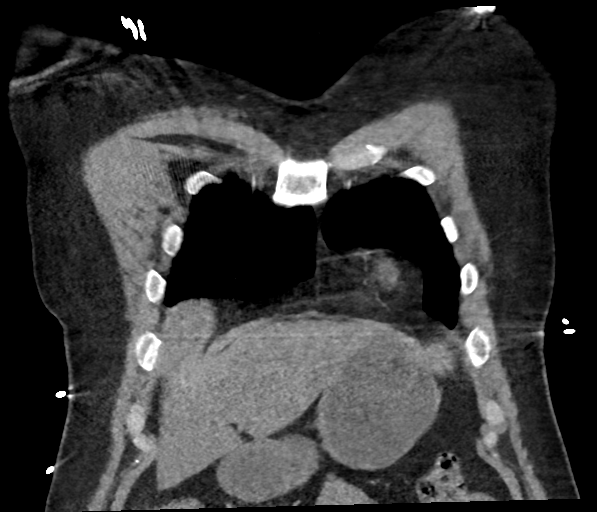
[im 76/151  soft-tissue]
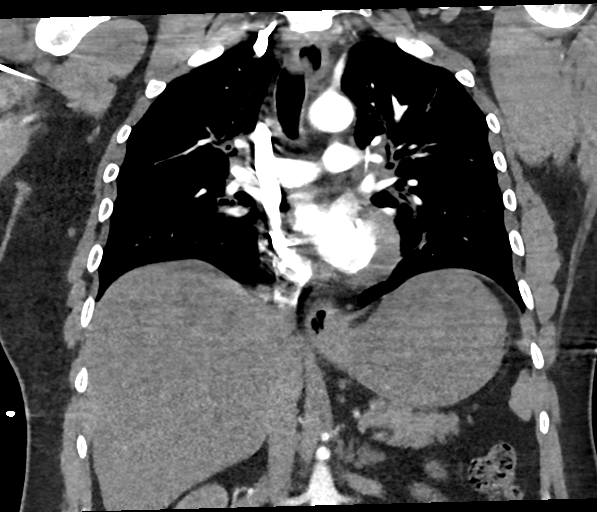
[im 113/151  soft-tissue]
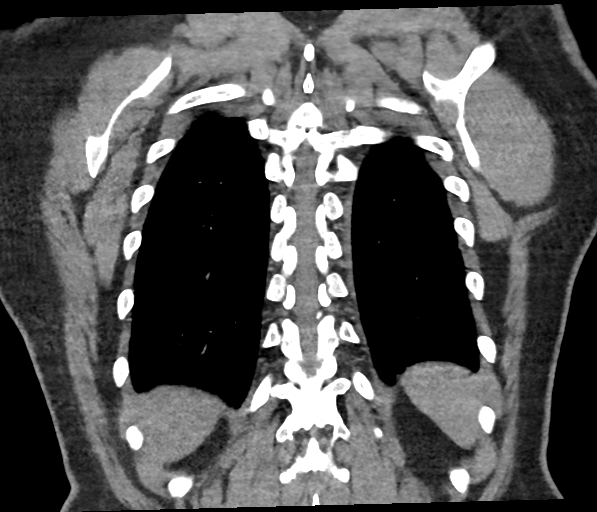

[19 of 46 positions shown; findings below may reference images not displayed]

FINDINGS: Cardiovascular: Normal heart size. No pericardial effusion. No
pulmonary artery filling defect. Negative aorta

Mediastinum/Nodes: Diffuse circumferential wall thickening of the
esophagus with surrounding fat haziness. No adenopathy.

Lungs/Pleura: Generalized bronchial wall and interlobular septal
thickening. No consolidation, effusion, or pneumothorax

Upper Abdomen: Distended stomach. Hepatic steatosis with
pericholecystic sparing.

Musculoskeletal: No acute finding. Bridging midthoracic osteophytes.

Review of the MIP images confirms the above findings.
IMPRESSION: 1. Findings of esophagitis.
2. Pulmonary interstitial edema.
3. Negative for pulmonary embolism or cardiomegaly.

## 2021-01-23 ENCOUNTER — Encounter: Payer: Self-pay | Admitting: Internal Medicine

## 2021-01-23 ENCOUNTER — Other Ambulatory Visit: Payer: Self-pay

## 2021-01-23 ENCOUNTER — Ambulatory Visit (INDEPENDENT_AMBULATORY_CARE_PROVIDER_SITE_OTHER): Payer: 59 | Admitting: Internal Medicine

## 2021-01-23 VITALS — BP 144/84 | HR 84 | Temp 98.1°F | Ht 69.0 in | Wt 194.0 lb

## 2021-01-23 DIAGNOSIS — R0602 Shortness of breath: Secondary | ICD-10-CM

## 2021-01-23 MED ORDER — BREO ELLIPTA 200-25 MCG/INH IN AEPB: 1.0000 | INHALATION_SPRAY | Freq: Every day | RESPIRATORY_TRACT | 3 refills | Status: DC

## 2021-01-23 NOTE — Patient Instructions (Addendum)
The patient should have follow up scheduled with myself in 1 months.   Prior to next visit patient should have: Spirometry/Feno - 30 minutes with appointment afterward  Start humidifer at night time while the heat is running. Start taking claritin once a day for allergies.  Start taking maintenance medication Breo once a day every day - even if you feel good. Gargle after use.   Take the albuterol rescue inhaler every 4 to 6 hours as needed for wheezing or shortness of breath. You can also take it 15 minutes before exercise or exertional activity. Side effects include heart racing or pounding, jitters or anxiety. If you have a history of an irregular heart rhythm, it can make this worse. Can also give some patients a hard time sleeping.  To inhale the aerosol using an inhaler, follow these steps:  1. Remove the protective dust cap from the end of the mouthpiece. If the dust cap was not placed on the mouthpiece, check the mouthpiece for dirt or other objects. Be sure that the canister is fully and firmly inserted in the mouthpiece. 2. If you are using the inhaler for the first time or if you have not used the inhaler in more than 14 days, you will need to prime it. You may also need to prime the inhaler if it has been dropped. Ask your pharmacist or check the manufacturer's information if this happens. To prime the inhaler, shake it well and then press down on the canister 4 times to release 4 sprays into the air, away from your face. Be careful not to get albuterol in your eyes. 3. Shake the inhaler well. 4. Breathe out as completely as possible through your mouth. 4. Hold the canister with the mouthpiece on the bottom, facing you and the canister pointing upward. Place the open end of the mouthpiece into your mouth. Close your lips tightly around the mouthpiece. 6. Breathe in slowly and deeply through the mouthpiece.At the same time, press down once on the container to spray the medication into  your mouth. 7. Try to hold your breath for 10 seconds. remove the inhaler, and breathe out slowly. 8. If you were told to use 2 puffs, wait 1 minute and then repeat steps 3-7. 9. Replace the protective cap on the inhaler. 10. Clean your inhaler regularly. Follow the manufacturer's directions carefully and ask your doctor or pharmacist if you have any questions about cleaning your inhaler.  Check the back of the inhaler to keep track of the total number of doses left on the inhaler.

## 2021-01-23 NOTE — Progress Notes (Signed)
Eddie Ray    [id number]    Feb 29, 1976  Primary Care Physician:Mann, Yetta Glassman, PA-C  Referring Physician: Shawnie Dapper, PA-C [address] [place],  [place] [address] Reason for Consultation: shortness of breath Date of Consultation: 01/23/2021  Chief complaint:   Chief Complaint  Patient presents with  . Follow-up    Reports dyspnea that increases with activity and warm weather. Denies cough     HPI: Eddie Ray is a 45 y.o. gentleman with shortness of breath. He notes for the past 4-5 months shortness of breath especially when he is exposed to heating.   He does have itchy watery eyes, nasal congestion. He does have cough after this as well. He does have frequent throat clearing as well.  He has a sensation of lump in his throat He does not smoke but does chew tobacco from [place]. He does not smoke and has never been a smoker. No passive smoke exposure. He does have wheezing.    He was given steroids (prednisone,) and an albuterol inhaler. He does feel better with albuterol inhaler. He takes albuterol about twice a day currently with relief.   He has no childhood respiratory disease.   He has no dyspnea with daily activities.   He does have snoring. He does have excessive daytime sleepiness and falls asleep watching tv and after meals. He does not fall asleep while talking to anyone.   They had a wood burning stove but it was outside the house in childhood. He is originally from [place].   Social history:  Occupation: Counsellor.  Exposures: lives at home with wife and three daughters.  Smoking history: never smoker  Social History   Occupational History  . Occupation: gas station  attendant  Tobacco Use  . Smoking status: Never Smoker  . Smokeless tobacco: Current User  Vaping Use  . Vaping Use: Never used  Substance and Sexual Activity  . Alcohol use: Yes  . Drug use: No  . Sexual activity: Not on file    Comment:  MARRIED    Relevant family history:  Family History  Problem Relation Age of Onset  . Diabetes Father   . Heart attack Father   . Diabetes Sister     Past Medical History:  Diagnosis Date  . Body mass index (BMI) 29.0-29.9, adult   . Hypothyroidism   . Hypothyroidism due to Hashimoto's thyroiditis 03/16/2017  . Left ventricular diastolic dysfunction   . Lower extremity edema   . Nausea & vomiting 08/05/2019  . Overweight   . Severe acute respiratory syndrome   . Thyroiditis, lymphocytic     History reviewed. No pertinent surgical history.   Physical Exam: Blood pressure (!) 144/84, pulse 84, temperature 98.1 F (36.7 C), height 5\' 9"  (1.753 m), weight 194 lb (88 kg), SpO2 96 %. Gen:      No acute distress ENT:  no nasal polyps, mucus membranes moist, mild derbris Lungs:    No increased respiratory effort, symmetric chest wall excursion, clear to auscultation bilaterally, no wheezes or crackles CV:         Regular rate and rhythm; no murmurs, rubs, or gallops.  No pedal edema Abd:      + bowel sounds; soft, non-tender; no distension MSK: no acute synovitis of DIP or PIP joints, no mechanics hands.  Skin:      Warm and dry; no rashes Neuro: normal speech, no focal facial asymmetry Psych:  alert and oriented x3, normal mood and affect   Data Reviewed/Medical Decision Making:  Independent interpretation of tests: Imaging: . Review of patient's chest xray images revealed no acute cardiopulmonary process - March 2022.  The patient's images have been independently reviewed by me.    PFTs: None on file  Labs:  Lab Results  Component Value Date   WBC 9.0 12/27/2020   HGB 14.9 12/27/2020   HCT 45.8 12/27/2020   MCV 97.0 12/27/2020   PLT 440 (H) 12/27/2020   Lab Results  Component Value Date   NA 139 12/27/2020   K 3.4 (L) 12/27/2020   CL 103 12/27/2020   CO2 24 12/27/2020    Immunization status:   There is no immunization history on file for this patient.  .  I reviewed prior external note(s) from ED visits, cardiology . I reviewed the result(s) of the labs and imaging as noted above.  . I have ordered PFTs   Assessment:  Mild persistent asthma Smokeless tobacco use - encouraged to quit Seasonal allergic rhinitis  Plan/Recommendations: Start taking claritin daily. Resume nasal sprays. Can consider montelukast at next visit.  Start taking breo 200 once a day. Continue albuterol as needed Will obtain Spirometry and FeNO at follow up visit  We discussed disease management and progression at length today regarding asthma.    Return to Care: Return in about 4 weeks (around 02/20/2021).  Durel Salts, MD Pulmonary and Critical Care Medicine [facility] [phone]  CC: Shawnie Dapper, [place]

## 2021-02-25 ENCOUNTER — Other Ambulatory Visit: Payer: Self-pay

## 2021-02-25 ENCOUNTER — Encounter: Payer: Self-pay | Admitting: Internal Medicine

## 2021-02-25 ENCOUNTER — Ambulatory Visit (INDEPENDENT_AMBULATORY_CARE_PROVIDER_SITE_OTHER): Payer: 59 | Admitting: Internal Medicine

## 2021-02-25 VITALS — BP 122/78 | HR 82 | Ht 69.0 in | Wt 195.0 lb

## 2021-02-25 DIAGNOSIS — J301 Allergic rhinitis due to pollen: Secondary | ICD-10-CM | POA: Diagnosis not present

## 2021-02-25 DIAGNOSIS — J4530 Mild persistent asthma, uncomplicated: Secondary | ICD-10-CM | POA: Diagnosis not present

## 2021-02-25 MED ORDER — BREO ELLIPTA 200-25 MCG/INH IN AEPB: 1.0000 | INHALATION_SPRAY | Freq: Every day | RESPIRATORY_TRACT | 5 refills | Status: DC

## 2021-02-25 MED ORDER — MONTELUKAST SODIUM 10 MG PO TABS: 10.0000 mg | ORAL_TABLET | Freq: Every day | ORAL | 5 refills | Status: DC

## 2021-02-25 MED ORDER — FLUTICASONE PROPIONATE 50 MCG/ACT NA SUSP: 1.0000 | Freq: Every day | NASAL | 5 refills | Status: DC

## 2021-02-25 NOTE — Patient Instructions (Addendum)
The patient should have follow up scheduled with myself in 4 months.   Prior to next visit patient should have: Spirometry/Feno - 30 minutes, with appointment afterwards.   Continue Breo once daily for asthma. Gargle after use.  Start taking singulair pill for allergies. Continue claritin. Start taking flonase nasal spray  Flonase - 1 spray on each side of your nose twice a day for first week, then 1 spray on each side.   Instructions for use:  If you also use a saline nasal spray or rinse, use that first.  Position the head with the chin slightly tucked. Use the right hand to spray into the left nostril and the right hand to spray into the left nostril.   Point the bottle away from the septum of your nose (cartilage that divides the two sides of your nose).   Hold the nostril closed on the opposite side from where you will spray  Spray once and gently sniff to pull the medicine into the higher parts of your nose.  Don't sniff too hard as the medicine will drain down the back of your throat instead.  Repeat with a second spray on the same side if prescribed.  Repeat on the other side of your nose.

## 2021-02-25 NOTE — Progress Notes (Signed)
         [place]    [id number]    [birth year]  Primary Care Physician:Mann, Delanna Ahmadi Date of Appointment: 02/25/2021 Established Patient Visit  Chief complaint:   Chief Complaint  Patient presents with  . Shortness of Breath    HPI: Eddie Ray is a 45 y.o. gentleman with history of asthma  Interval Updates: At last visit started on Breo once daily with albuterol prn. Much improved, no more wheezing or dyspnea.  No albuterol use. Having issues with seasonal allergies. Has been living in [place] for ten years, this is the first year this is happening. Post nasal drainage, itchying, watery eyes.    I have reviewed the patient's family social and past medical history and updated as appropriate.   Past Medical History:  Diagnosis Date  . Body mass index (BMI) 29.0-29.9, adult   . Hypothyroidism   . Hypothyroidism due to Hashimoto's thyroiditis 03/16/2017  . Left ventricular diastolic dysfunction   . Lower extremity edema   . Nausea & vomiting 08/05/2019  . Overweight   . Severe acute respiratory syndrome   . Thyroiditis, lymphocytic     History reviewed. No pertinent surgical history.  Family History  Problem Relation Age of Onset  . Diabetes Father   . Heart attack Father   . Diabetes Sister     Social History   Occupational History  . Occupation: gas station  attendant  Tobacco Use  . Smoking status: Never Smoker  . Smokeless tobacco: Current User  Vaping Use  . Vaping Use: Never used  Substance and Sexual Activity  . Alcohol use: Yes  . Drug use: No  . Sexual activity: Not on file    Comment: MARRIED     Physical Exam: Blood pressure 122/78, pulse 82, height 5\' 9"  (1.753 m), weight 195 lb (88.5 kg), SpO2 99 %.  Gen:      No acute distress ENT:  no nasal polyps, mucus membranes moist Lungs:    No increased respiratory effort, symmetric chest wall excursion, clear to auscultation bilaterally, no wheezes or crackles CV:         Regular  rate and rhythm; no murmurs, rubs, or gallops.  No pedal edema   Data Reviewed: Imaging: I have personally reviewed the chest xray March 2022, no acute process  PFTs: None on file  Labs:  Immunization status:  There is no immunization history on file for this patient.  Assessment:  Mild persistent asthma - improved Seasonal allergic rhinitis - not well controlled  Plan/Recommendations: Continue Breo. Continue prn albuterol Continue claritin. Start flonase and singulair.  PFTs at next visit.  Return to Care: Return in about 4 months (around 06/28/2021).   08/28/2021, MD Pulmonary and Critical Care Medicine [facility] [phone]

## 2021-09-30 ENCOUNTER — Other Ambulatory Visit: Payer: Self-pay | Admitting: Otolaryngology

## 2021-09-30 DIAGNOSIS — J329 Chronic sinusitis, unspecified: Secondary | ICD-10-CM

## 2021-10-11 ENCOUNTER — Ambulatory Visit
Admission: RE | Admit: 2021-10-11 | Discharge: 2021-10-11 | Disposition: A | Discharge status: Home / Self Care | Payer: 59 | Source: Ambulatory Visit | Attending: Otolaryngology | Admitting: Otolaryngology

## 2021-10-11 ENCOUNTER — Other Ambulatory Visit: Payer: Self-pay

## 2021-10-11 DIAGNOSIS — J329 Chronic sinusitis, unspecified: Secondary | ICD-10-CM

## 2022-06-16 ENCOUNTER — Encounter: Payer: Self-pay | Admitting: Cardiovascular Disease

## 2022-06-16 NOTE — Progress Notes (Unsigned)
Cardiology Office Note:    Date:  06/17/2022   ID:  Eddie Ray, DOB Feb 28, 1976, MRN [id number]  PCP:  Shawnie Dapper, PA-C  Cardiologist:  Felita Bump  Electrophysiologist:  None   Referring MD: Shawnie Dapper, PA-C   Chief Complaint  Patient presents with   Congestive Heart Failure         Nov. 9, 2020    Eddie Ray is a 46 y.o. male with a hx of hypothyroidism and a recent hospitalization for shortness of breath.     He had stopped his synthroid  ( ran out of synroid meds due to covid. )  Echocardiogram revealed normal left ventricular systolic function with an ejection fraction of 65 to 70%.  He has grade 1 diastolic dysfunction.  We are asked to see him today for further evaluation of this chronic diastolic congestive heart failure by Dr. Sherwood Gambler.   Feeling better now that he has restarted his synthroid Has started walking .    I have personally reviewed the echo images.  I think that any degree of diastolic dysfunction is probably very minimal.  Likely is also reversible.  He started walking since he left the hospital.  His weight is already down 2 pounds. He feels well when he is walking.   Aug. 22, 2023 [name] is seen after a 3 year absence.  Was seen by Chelsea Aus in 02/2020 Hx of diastolic CHF Hx of dyspnea   Had some nasal septum surgery in [place] in Feb. 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 No pertinent surgical history.  Current Medications: Current Meds  Medication Sig    fluticasone (FLONASE) 50 MCG/ACT nasal spray Place 1 spray into both nostrils daily.   fluticasone furoate-vilanterol (BREO ELLIPTA) 200-25 MCG/INH AEPB Inhale 1 puff into the lungs daily.   hydrochlorothiazide (HYDRODIURIL) 25 MG tablet Take 1 tablet (25 mg total) by mouth daily.   levothyroxine (SYNTHROID) 112 MCG tablet Take 112 mcg by mouth daily.   montelukast (SINGULAIR) 10 MG tablet Take 1 tablet (10 mg total) by mouth at bedtime.   potassium chloride SA (KLOR-CON M20) 20 MEQ tablet Take 1 tablet (20 mEq total) by mouth daily.   [DISCONTINUED] lisinopril (ZESTRIL) 10 MG tablet Take 10 mg by mouth daily.     Allergies:   Patient has no known allergies.   Social History   Socioeconomic History   Marital status: Married    Spouse name: Not on file   Number of children: Not on file   Years of education: Not on file   Highest education level: Not on file  Occupational History   Occupation: gas station  attendant  Tobacco Use   Smoking status: Never   Smokeless tobacco: Current  Vaping Use   Vaping Use: Never used  Substance and Sexual Activity   Alcohol use: Yes   Drug use: No   Sexual activity: Not on file  Comment: MARRIED  Other Topics Concern   Not on file  Social History Narrative   Not on file   Social Determinants of Health   Financial Resource Strain: Not on file  Food Insecurity: Not on file  Transportation Needs: Not on file  Physical Activity: Not on file  Stress: Not on file  Social Connections: Not on file     Family History: The patient's family history includes Diabetes in his father and sister; Heart attack in his father.  ROS:   Please see the history of present illness.     All other systems reviewed and are negative.  EKGs/Labs/Other Studies Reviewed:    The following studies were reviewed today:   EKG: June 17, 2022: Normal sinus rhythm at 74  Recent Labs: No results found for requested labs within last 365 days.  Recent Lipid  Panel    Component Value Date/Time   TRIG 103 08/05/2019 0220    Physical Exam:     Physical Exam: Blood pressure (!) 150/98, pulse 74, height 5\' 9"  (1.753 m), weight 211 lb 3.2 oz (95.8 kg), SpO2 96 %.  GEN:  Well nourished, well developed in no acute distress HEENT: Normal NECK: No JVD; No carotid bruits LYMPHATICS: No lymphadenopathy CARDIAC: RRR , no murmurs, rubs, gallops RESPIRATORY:  Clear to auscultation without rales, wheezing or rhonchi  ABDOMEN: Soft, non-tender, non-distended MUSCULOSKELETAL:  No edema; No deformity  SKIN: Warm and dry NEUROLOGIC:  Alert and oriented x 3     ASSESSMENT:    1. Primary hypertension   2. Chronic diastolic heart failure (HCC)     PLAN:     Chronic diastolic dysfunction :  stable   .  Cont meds.  Advised better BP control, adequate weight control  2.  HTN  - mildly elevated today .   Add HCTZ 25 mg a day , Kdur 20 meq a day ,  BMP in 2 weeks.  Return to see me in 3 months    Medication Adjustments/Labs and Tests Ordered: Current medicines are reviewed at length with the patient today.  Concerns regarding medicines are outlined above.  Orders Placed This Encounter  Procedures   Basic metabolic panel   EKG 12-Lead   Meds ordered this encounter  Medications   hydrochlorothiazide (HYDRODIURIL) 25 MG tablet    Sig: Take 1 tablet (25 mg total) by mouth daily.    Dispense:  90 tablet    Refill:  3   potassium chloride SA (KLOR-CON M20) 20 MEQ tablet    Sig: Take 1 tablet (20 mEq total) by mouth daily.    Dispense:  90 tablet    Refill:  3   lisinopril (ZESTRIL) 10 MG tablet    Sig: Take 1 tablet (10 mg total) by mouth daily.    Dispense:  90 tablet    Refill:  3    Patient Instructions  Medication Instructions:  START Hydrochlorothiazide 25mg  daily START Potassium Chloride daily REFILLED Lisinopril *If you need a refill on your cardiac medications before your next appointment, please call your  pharmacy*   Lab Work: BMET in 3 weeks If you have labs (blood work) drawn today and your tests are completely normal, you will receive your results only by: MyChart Message (if you have MyChart) OR A paper copy in the mail If you have any lab test that is abnormal or we need to change your treatment, we will call you to review the results.   Testing/Procedures: NONE  Follow-Up: At [facility], you and your health needs are our priority.  As part of our continuing mission to provide you with exceptional heart care, we have created designated Provider Care Teams.  These Care Teams include your primary Cardiologist (physician) and Advanced Practice Providers (APPs -  Physician Assistants and Nurse Practitioners) who all work together to provide you with the care you need, when you need it.  We recommend signing up for the patient portal called "MyChart".  Sign up information is provided on this After Visit Summary.  MyChart is used to connect with patients for Virtual Visits (Telemedicine).  Patients are able to view lab/test results, encounter notes, upcoming appointments, etc.  Non-urgent messages can be sent to your provider as well.   To learn more about what you can do with MyChart, go to [email or web address].    Your next appointment:   3 month(s)  The format for your next appointment:   In Person  Provider:   Kristeen Miss, MD       Important Information About Sugar         Signed, Kristeen Miss, MD  06/17/2022 11:58 AM    [facility]

## 2022-06-17 ENCOUNTER — Ambulatory Visit (INDEPENDENT_AMBULATORY_CARE_PROVIDER_SITE_OTHER): Payer: 59 | Admitting: Cardiovascular Disease

## 2022-06-17 ENCOUNTER — Encounter: Payer: Self-pay | Admitting: Cardiovascular Disease

## 2022-06-17 VITALS — BP 150/98 | HR 74 | Ht 69.0 in | Wt 211.2 lb

## 2022-06-17 DIAGNOSIS — I10 Essential (primary) hypertension: Secondary | ICD-10-CM | POA: Diagnosis not present

## 2022-06-17 DIAGNOSIS — I5032 Chronic diastolic (congestive) heart failure: Secondary | ICD-10-CM | POA: Diagnosis not present

## 2022-06-17 MED ORDER — LISINOPRIL 10 MG PO TABS: 10.0000 mg | ORAL_TABLET | Freq: Every day | ORAL | 3 refills | Status: DC

## 2022-06-17 MED ORDER — HYDROCHLOROTHIAZIDE 25 MG PO TABS: 25.0000 mg | ORAL_TABLET | Freq: Every day | ORAL | 3 refills | Status: DC

## 2022-06-17 MED ORDER — POTASSIUM CHLORIDE CRYS ER 20 MEQ PO TBCR: 20.0000 meq | EXTENDED_RELEASE_TABLET | Freq: Every day | ORAL | 3 refills | Status: DC

## 2022-06-17 NOTE — Patient Instructions (Signed)
Medication Instructions:  START Hydrochlorothiazide 25mg  daily START Potassium Chloride daily REFILLED Lisinopril *If you need a refill on your cardiac medications before your next appointment, please call your pharmacy*   Lab Work: BMET in 3 weeks If you have labs (blood work) drawn today and your tests are completely normal, you will receive your results only by: MyChart Message (if you have MyChart) OR A paper copy in the mail If you have any lab test that is abnormal or we need to change your treatment, we will call you to review the results.   Testing/Procedures: NONE   Follow-Up: At [facility], you and your health needs are our priority.  As part of our continuing mission to provide you with exceptional heart care, we have created designated Provider Care Teams.  These Care Teams include your primary Cardiologist (physician) and Advanced Practice Providers (APPs -  Physician Assistants and Nurse Practitioners) who all work together to provide you with the care you need, when you need it.  We recommend signing up for the patient portal called "MyChart".  Sign up information is provided on this After Visit Summary.  MyChart is used to connect with patients for Virtual Visits (Telemedicine).  Patients are able to view lab/test results, encounter notes, upcoming appointments, etc.  Non-urgent messages can be sent to your provider as well.   To learn more about what you can do with MyChart, go to [facility].    Your next appointment:   3 month(s)  The format for your next appointment:   In Person  Provider:   [email or web address], MD       Important Information About Sugar

## 2022-09-16 ENCOUNTER — Encounter: Payer: Self-pay | Admitting: Cardiovascular Disease

## 2022-09-16 ENCOUNTER — Ambulatory Visit: Payer: 59 | Attending: Cardiovascular Disease | Admitting: Cardiovascular Disease

## 2022-09-16 VITALS — BP 134/86 | HR 95 | Ht 68.0 in | Wt 217.4 lb

## 2022-09-16 DIAGNOSIS — I10 Essential (primary) hypertension: Secondary | ICD-10-CM

## 2022-09-16 DIAGNOSIS — E6609 Other obesity due to excess calories: Secondary | ICD-10-CM

## 2022-09-16 NOTE — Progress Notes (Signed)
Cardiology Office Note:    Date:  09/16/2022   ID:  Eddie Ray, DOB 1976-01-03, MRN [id number]  PCP:  Eddie Dapper, PA-C  Cardiologist:  Eddie Ray  Electrophysiologist:  None   Referring MD: Eddie Dapper, PA-C   Chief Complaint  Patient presents with   Hypertension        Congestive Heart Failure    Nov. 9, 2020    Eddie Ray is a 46 y.o. male with a hx of hypothyroidism and a recent hospitalization for shortness of breath.     He had stopped his synthroid  ( ran out of synroid meds due to covid. )  Echocardiogram revealed normal left ventricular systolic function with an ejection fraction of 65 to 70%.  He has grade 1 diastolic dysfunction.  We are asked to see him today for further evaluation of this chronic diastolic congestive heart failure by Eddie Ray.   Feeling better now that he has restarted his synthroid Has started walking .    I have personally reviewed the echo images.  I think that any degree of diastolic dysfunction is probably very minimal.  Likely is also reversible.  He started walking since he left the hospital.  His weight is already down 2 pounds. He feels well when he is walking.   Aug. 22, 2023 Eddie Ray is seen after a 3 year absence.  Was seen by Eddie Ray in 02/2020 Hx of diastolic CHF Hx of dyspnea   Had some nasal septum surgery in [place] in Feb. 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 Doing well  Is not working ,     Past Medical History:  Diagnosis Date   Body mass index (BMI) 29.0-29.9, adult    Hypothyroidism    Hypothyroidism due to Hashimoto's thyroiditis 03/16/2017   Left ventricular diastolic dysfunction    Lower extremity edema    Nausea & vomiting 08/05/2019   Overweight    Severe acute respiratory syndrome     Thyroiditis, lymphocytic     History reviewed. No pertinent surgical history.  Current Medications: Current Meds  Medication Sig   fluticasone (FLONASE) 50 MCG/ACT nasal spray Place 1 spray into both nostrils daily.   fluticasone furoate-vilanterol (BREO ELLIPTA) 200-25 MCG/INH AEPB Inhale 1 puff into the lungs daily.   hydrochlorothiazide (HYDRODIURIL) 25 MG tablet Take 1 tablet (25 mg total) by mouth daily.   levothyroxine (SYNTHROID) 112 MCG tablet Take 112 mcg by mouth daily.   lisinopril (ZESTRIL) 10 MG tablet Take 1 tablet (10 mg total) by mouth daily.   montelukast (SINGULAIR) 10 MG tablet Take 1 tablet (10 mg total) by mouth at bedtime.   potassium chloride SA (KLOR-CON M20) 20 MEQ tablet Take 1 tablet (20 mEq total) by mouth daily.   predniSONE (DELTASONE) 50 MG tablet Take 1 tablet daily for 5 days     Allergies:   Patient has no known allergies.   Social History   Socioeconomic History   Marital status: Married    Spouse name: Not on file   Number of children: Not on file   Years of education: Not on file   Highest education level: Not on file  Occupational History   Occupation: gas station  attendant  Tobacco  Use   Smoking status: Never   Smokeless tobacco: Current  Vaping Use   Vaping Use: Never used  Substance and Sexual Activity   Alcohol use: Yes   Drug use: No   Sexual activity: Not on file    Comment: MARRIED  Other Topics Concern   Not on file  Social History Narrative   Not on file   Social Determinants of Health   Financial Resource Strain: Not on file  Food Insecurity: Not on file  Transportation Needs: Not on file  Physical Activity: Not on file  Stress: Not on file  Social Connections: Not on file     Family History: The patient's family history includes Diabetes in his father and sister; Heart attack in his father.  ROS:   Please see the history of present illness.     All other systems reviewed and are  negative.  EKGs/Labs/Other Studies Reviewed:    The following studies were reviewed today:   EKG:    Recent Labs: No results found for requested labs within last 365 days.  Recent Lipid Panel    Component Value Date/Time   TRIG 103 08/05/2019 0220    Physical Exam:     Physical Exam: Blood pressure 134/86, pulse 95, height 5\' 8"  (1.727 m), weight 217 lb 6.4 oz (98.6 kg), SpO2 96 %.       GEN:  moderatley obese male,  in no acute distress HEENT: Normal NECK: No JVD; No carotid bruits LYMPHATICS: No lymphadenopathy CARDIAC: RRR , no murmurs, rubs, gallops RESPIRATORY:  Clear to auscultation without rales, wheezing or rhonchi  ABDOMEN: Soft, non-tender, non-distended MUSCULOSKELETAL:  No edema; No deformity  SKIN: Warm and dry NEUROLOGIC:  Alert and oriented x 3    ASSESSMENT:    1. Primary hypertension   2. Obesity due to excess calories, unspecified classification, unspecified whether serious comorbidity present      PLAN:     Chronic diastolic dysfunction :     2.  HTN  -blood pressure is better.  He still needs to work on weight loss.  I encouraged him to cut back on his portions.  He needs to start exercising.  We outlined regular exercise program.   3.  Obesity: He is to continue with weight loss efforts.      Medication Adjustments/Labs and Tests Ordered: Current medicines are reviewed at length with the patient today.  Concerns regarding medicines are outlined above.  No orders of the defined types were placed in this encounter.  No orders of the defined types were placed in this encounter.   Patient Instructions  Medication Instructions:  Your physician recommends that you continue on your current medications as directed. Please refer to the Current Medication list given to you today.  *If you need a refill on your cardiac medications before your next appointment, please call your pharmacy*  Lab Work: NONE If you have labs (blood work)  drawn today and your tests are completely normal, you will receive your results only by: MyChart Message (if you have MyChart) OR A paper copy in the mail If you have any lab test that is abnormal or we need to change your treatment, we will call you to review the results.  Testing/Procedures: NONE   Follow-Up: At [facility], you and your health needs are our priority.  As part of our continuing mission to provide you with exceptional heart care, we have created designated Provider Care Teams.  These Care Teams include  your primary Cardiologist (physician) and Advanced Practice Providers (APPs -  Physician Assistants and Nurse Practitioners) who all work together to provide you with the care you need, when you need it.  We recommend signing up for the patient portal called "MyChart".  Sign up information is provided on this After Visit Summary.  MyChart is used to connect with patients for Virtual Visits (Telemedicine).  Patients are able to view lab/test results, encounter notes, upcoming appointments, etc.  Non-urgent messages can be sent to your provider as well.   To learn more about what you can do with MyChart, go to [email or web address].    Your next appointment:   6 month(s)  The format for your next appointment:   In Person  Provider:   Eligha Bridegroom, NP       Important Information About Sugar       Adopting a Healthy Lifestyle.   Weight: Know what a healthy weight is for you (roughly BMI <25) and aim to maintain this. You can calculate your body mass index on your smart phone  Diet: Aim for 7+ servings of fruits and vegetables daily Limit animal fats in diet for cholesterol and heart health - choose grass fed whenever available Avoid highly processed foods (fast food burgers, tacos, fried chicken, pizza, hot dogs, french fries)  Saturated fat comes in the form of butter, lard, coconut oil, margarine, partially hydrogenated oils, and fat in meat. These  increase your risk of cardiovascular disease.  Use healthy plant oils, such as olive, canola, soy, corn, sunflower and peanut.  Whole foods such as fruits, vegetables and whole grains have fiber  Men need > 38 grams of fiber per day Women need > 25 grams of fiber per day  Load up on vegetables and fruits - one-half of your plate: Aim for color and variety, and remember that potatoes dont count. Go for whole grains - one-quarter of your plate: Whole wheat, barley, wheat berries, quinoa, oats, brown rice, and foods made with them. If you want pasta, go with whole wheat pasta. Protein power - one-quarter of your plate: Fish, chicken, beans, and nuts are all healthy, versatile protein sources. Limit red meat. You need carbohydrates for energy! The type of carbohydrate is more important than the amount. Choose carbohydrates such as vegetables, fruits, whole grains, beans, and nuts in the place of white rice, white pasta, potatoes (baked or fried), macaroni and cheese, cakes, cookies, and donuts.  If youre thirsty, drink water. Coffee and tea are good in moderation, but skip sugary drinks and limit milk and dairy products to one or two daily servings. Keep sugar intake at 6 teaspoons or 24 grams or LESS       Exercise: Aim for 150 min of moderate intensity exercise weekly for heart health, and weights twice weekly for bone health Stay active - any steps are better than no steps! Aim for 7-9 hours of sleep daily         Signed, Kristeen Miss, MD  09/16/2022 5:50 PM    [facility]

## 2022-09-16 NOTE — Patient Instructions (Addendum)
Medication Instructions:  Your physician recommends that you continue on your current medications as directed. Please refer to the Current Medication list given to you today.  *If you need a refill on your cardiac medications before your next appointment, please call your pharmacy*  Lab Work: NONE If you have labs (blood work) drawn today and your tests are completely normal, you will receive your results only by: MyChart Message (if you have MyChart) OR A paper copy in the mail If you have any lab test that is abnormal or we need to change your treatment, we will call you to review the results.  Testing/Procedures: NONE   Follow-Up: At [facility], you and your health needs are our priority.  As part of our continuing mission to provide you with exceptional heart care, we have created designated Provider Care Teams.  These Care Teams include your primary Cardiologist (physician) and Advanced Practice Providers (APPs -  Physician Assistants and Nurse Practitioners) who all work together to provide you with the care you need, when you need it.  We recommend signing up for the patient portal called "MyChart".  Sign up information is provided on this After Visit Summary.  MyChart is used to connect with patients for Virtual Visits (Telemedicine).  Patients are able to view lab/test results, encounter notes, upcoming appointments, etc.  Non-urgent messages can be sent to your provider as well.   To learn more about what you can do with MyChart, go to [email or web address].    Your next appointment:   6 month(s)  The format for your next appointment:   In Person  Provider:   Eligha Bridegroom, NP       Important Information About Sugar       Adopting a Healthy Lifestyle.   Weight: Know what a healthy weight is for you (roughly BMI <25) and aim to maintain this. You can calculate your body mass index on your smart phone  Diet: Aim for 7+ servings of fruits and  vegetables daily Limit animal fats in diet for cholesterol and heart health - choose grass fed whenever available Avoid highly processed foods (fast food burgers, tacos, fried chicken, pizza, hot dogs, french fries)  Saturated fat comes in the form of butter, lard, coconut oil, margarine, partially hydrogenated oils, and fat in meat. These increase your risk of cardiovascular disease.  Use healthy plant oils, such as olive, canola, soy, corn, sunflower and peanut.  Whole foods such as fruits, vegetables and whole grains have fiber  Men need > 38 grams of fiber per day Women need > 25 grams of fiber per day  Load up on vegetables and fruits - one-half of your plate: Aim for color and variety, and remember that potatoes dont count. Go for whole grains - one-quarter of your plate: Whole wheat, barley, wheat berries, quinoa, oats, brown rice, and foods made with them. If you want pasta, go with whole wheat pasta. Protein power - one-quarter of your plate: Fish, chicken, beans, and nuts are all healthy, versatile protein sources. Limit red meat. You need carbohydrates for energy! The type of carbohydrate is more important than the amount. Choose carbohydrates such as vegetables, fruits, whole grains, beans, and nuts in the place of white rice, white pasta, potatoes (baked or fried), macaroni and cheese, cakes, cookies, and donuts.  If youre thirsty, drink water. Coffee and tea are good in moderation, but skip sugary drinks and limit milk and dairy products to one or two daily servings. Keep sugar  intake at 6 teaspoons or 24 grams or LESS       Exercise: Aim for 150 min of moderate intensity exercise weekly for heart health, and weights twice weekly for bone health Stay active - any steps are better than no steps! Aim for 7-9 hours of sleep daily

## 2023-04-27 ENCOUNTER — Other Ambulatory Visit (HOSPITAL_COMMUNITY): Payer: Self-pay | Admitting: Podiatry

## 2023-04-27 DIAGNOSIS — S93692A Other sprain of left foot, initial encounter: Secondary | ICD-10-CM

## 2023-04-27 DIAGNOSIS — S86112A Strain of other muscle(s) and tendon(s) of posterior muscle group at lower leg level, left leg, initial encounter: Secondary | ICD-10-CM

## 2023-05-04 ENCOUNTER — Ambulatory Visit (HOSPITAL_COMMUNITY)
Admission: RE | Admit: 2023-05-04 | Discharge: 2023-05-04 | Disposition: A | Discharge status: Home / Self Care | Payer: 59 | Source: Ambulatory Visit | Attending: Podiatry | Admitting: Podiatry

## 2023-05-04 DIAGNOSIS — S93692A Other sprain of left foot, initial encounter: Secondary | ICD-10-CM | POA: Insufficient documentation

## 2023-05-04 DIAGNOSIS — S86112A Strain of other muscle(s) and tendon(s) of posterior muscle group at lower leg level, left leg, initial encounter: Secondary | ICD-10-CM | POA: Insufficient documentation

## 2023-05-20 ENCOUNTER — Other Ambulatory Visit (HOSPITAL_COMMUNITY): Payer: 59

## 2023-07-05 ENCOUNTER — Other Ambulatory Visit: Payer: Self-pay | Admitting: Cardiovascular Disease

## 2023-07-06 ENCOUNTER — Other Ambulatory Visit: Payer: Self-pay | Admitting: Cardiovascular Disease

## 2023-07-06 MED ORDER — POTASSIUM CHLORIDE CRYS ER 20 MEQ PO TBCR: 20.0000 meq | EXTENDED_RELEASE_TABLET | Freq: Every day | ORAL | 0 refills | Status: DC

## 2023-07-31 ENCOUNTER — Other Ambulatory Visit: Payer: Self-pay | Admitting: Cardiovascular Disease

## 2023-08-05 ENCOUNTER — Other Ambulatory Visit: Payer: Self-pay | Admitting: Cardiovascular Disease

## 2023-10-04 ENCOUNTER — Other Ambulatory Visit: Payer: Self-pay | Admitting: Cardiovascular Disease

## 2023-10-20 ENCOUNTER — Other Ambulatory Visit: Payer: Self-pay | Admitting: Cardiovascular Disease

## 2023-11-03 ENCOUNTER — Other Ambulatory Visit: Payer: Self-pay | Admitting: Cardiovascular Disease

## 2023-12-04 ENCOUNTER — Encounter: Payer: Self-pay | Admitting: Internal Medicine

## 2023-12-04 ENCOUNTER — Ambulatory Visit: Payer: 59 | Admitting: Internal Medicine

## 2023-12-04 VITALS — BP 146/88 | HR 75 | Ht 69.0 in | Wt 204.0 lb

## 2023-12-04 DIAGNOSIS — I5032 Chronic diastolic (congestive) heart failure: Secondary | ICD-10-CM

## 2023-12-04 DIAGNOSIS — E063 Autoimmune thyroiditis: Secondary | ICD-10-CM

## 2023-12-04 DIAGNOSIS — J329 Chronic sinusitis, unspecified: Secondary | ICD-10-CM | POA: Insufficient documentation

## 2023-12-04 DIAGNOSIS — E782 Mixed hyperlipidemia: Secondary | ICD-10-CM | POA: Insufficient documentation

## 2023-12-04 DIAGNOSIS — Z1159 Encounter for screening for other viral diseases: Secondary | ICD-10-CM

## 2023-12-04 DIAGNOSIS — J4530 Mild persistent asthma, uncomplicated: Secondary | ICD-10-CM | POA: Insufficient documentation

## 2023-12-04 DIAGNOSIS — I10 Essential (primary) hypertension: Secondary | ICD-10-CM | POA: Insufficient documentation

## 2023-12-04 DIAGNOSIS — J4540 Moderate persistent asthma, uncomplicated: Secondary | ICD-10-CM | POA: Insufficient documentation

## 2023-12-04 DIAGNOSIS — J189 Pneumonia, unspecified organism: Secondary | ICD-10-CM | POA: Insufficient documentation

## 2023-12-04 MED ORDER — ALBUTEROL SULFATE HFA 108 (90 BASE) MCG/ACT IN AERS: 2.0000 | INHALATION_SPRAY | Freq: Four times a day (QID) | RESPIRATORY_TRACT | 1 refills | Status: DC | PRN

## 2023-12-04 MED ORDER — LISINOPRIL 5 MG PO TABS
5.0000 mg | ORAL_TABLET | Freq: Every day | ORAL | 3 refills | Status: DC
Start: 2023-12-04 — End: 2024-03-08

## 2023-12-04 MED ORDER — LEVOTHYROXINE SODIUM 112 MCG PO TABS: 112.0000 ug | ORAL_TABLET | Freq: Every day | ORAL | 1 refills | Status: DC

## 2023-12-04 MED ORDER — FLUTICASONE PROPIONATE 50 MCG/ACT NA SUSP: 1.0000 | Freq: Every day | NASAL | 5 refills | Status: DC

## 2023-12-04 NOTE — Assessment & Plan Note (Signed)
 Recently treated with Augmentin  in 12/24 Check repeat CXR for confirming resolution

## 2023-12-04 NOTE — Patient Instructions (Signed)
 Please start taking Lisinopril  5 mg once daily.  Please follow DASH diet and perform moderate exercise/walking at least 150 mins/week.

## 2023-12-04 NOTE — Assessment & Plan Note (Signed)
 Last TSH wnl in 11/24 Continue levothyroxine  112 mcg QD

## 2023-12-04 NOTE — Progress Notes (Signed)
 New Patient Office Visit  Subjective:  Patient ID: Eddie Ray, male    DOB: 02/26/1976  Age: 48 y.o. MRN: [id number]  CC:  Chief Complaint  Patient presents with   Establish Care    New patient establishing care.     HPI Eddie Ray is a 48 y.o. male with past medical history of HTN, hypothyroidism, CHF, asthma and obesity who  presents for establishing care.  HTN: His BP was elevated today.  He was prescribed lisinopril  10 mg by previous PCP, but he did not take it as he reports that his blood pressure was better at home later.  He has not checked BP recently.  He has history of diastolic CHF related pulmonary edema in the past, which was likely provoked due to uncontrolled hypothyroidism.  He currently denies any headache, dizziness, chest pain, dyspnea or palpitations.  He was admitted 07/2019 for SOB in setting of missed synthroid  ( run out of medications due to COVID). Echo showed LVEF of 65-70% and garde 1 DD. Established care with cardiology - Dr. Alveta 08/2019. Improved dyspnea after synthroid  restarted.   He also reports having CAP in 12/24 after her trip to [place].  [facility], was given Augmentin  and oral prednisone , which he has completed now.  He denies any dyspnea or wheezing currently.  He has history of asthma, has been evaluated by pulmonology.  He has been prescribed Trelegy, but uses it as needed.  He agrees to start using Trelegy regularly and use albuterol  as needed instead.  Denies smoking history.  He does chew tobacco.  Past Medical History:  Diagnosis Date   Body mass index (BMI) 29.0-29.9, adult    Hypothyroidism    Hypothyroidism due to Hashimoto's thyroiditis 03/16/2017   Left ventricular diastolic dysfunction    Lower extremity edema    Nausea & vomiting 08/05/2019   Overweight    Severe acute respiratory syndrome    Thyroiditis, lymphocytic     History reviewed. No pertinent surgical history.  Family History   Problem Relation Age of Onset   Diabetes Father    Heart attack Father    Diabetes Sister     Social History   Socioeconomic History   Marital status: Married    Spouse name: Not on file   Number of children: Not on file   Years of education: Not on file   Highest education level: Not on file  Occupational History   Occupation: gas station  attendant  Tobacco Use   Smoking status: Never   Smokeless tobacco: Current  Vaping Use   Vaping status: Never Used  Substance and Sexual Activity   Alcohol use: Yes   Drug use: No   Sexual activity: Not on file    Comment: MARRIED  Other Topics Concern   Not on file  Social History Narrative   Not on file   Social Drivers of Health   Financial Resource Strain: Not on file  Food Insecurity: Not on file  Transportation Needs: Not on file  Physical Activity: Not on file  Stress: Not on file  Social Connections: Not on file  Intimate Partner Violence: Not on file    ROS Review of Systems  Constitutional:  Negative for chills and fever.  HENT:  Negative for congestion and sore throat.   Eyes:  Negative for pain and discharge.  Respiratory:  Negative for cough and shortness of breath.   Cardiovascular:  Negative for chest pain and palpitations.  Gastrointestinal:  Negative for constipation, diarrhea, nausea and vomiting.  Endocrine: Negative for polydipsia and polyuria.  Genitourinary:  Negative for dysuria and hematuria.  Musculoskeletal:  Negative for neck pain and neck stiffness.  Skin:  Negative for rash.  Neurological:  Negative for dizziness, weakness, numbness and headaches.  Psychiatric/Behavioral:  Negative for agitation and behavioral problems.     Objective:   Today's Vitals: BP (!) 146/88 (BP Location: Left Arm)   Pulse 75   Ht 5' 9 (1.753 m)   Wt 204 lb (92.5 kg)   SpO2 94%   BMI 30.13 kg/m   Physical Exam Vitals reviewed.  Constitutional:      General: He is not in acute distress.    Appearance:  He is obese. He is not diaphoretic.  HENT:     Head: Normocephalic and atraumatic.     Nose: Nose normal.     Mouth/Throat:     Mouth: Mucous membranes are moist.  Eyes:     General: No scleral icterus.    Extraocular Movements: Extraocular movements intact.  Cardiovascular:     Rate and Rhythm: Normal rate and regular rhythm.     Heart sounds: Normal heart sounds. No murmur heard. Pulmonary:     Breath sounds: Normal breath sounds. No wheezing or rales.  Musculoskeletal:     Cervical back: Neck supple. No tenderness.     Right lower leg: No edema.     Left lower leg: No edema.  Skin:    General: Skin is warm.     Findings: No rash.  Neurological:     General: No focal deficit present.     Mental Status: He is alert and oriented to person, place, and time.  Psychiatric:        Mood and Affect: Mood normal.        Behavior: Behavior normal.     Assessment & Plan:   Problem List Items Addressed This Visit       Cardiovascular and Mediastinum   Chronic diastolic CHF (congestive heart failure) (HCC)   Likely due to uncontrolled hypothyroidism in the past Appears euvolemic currently Restart lisinopril  for HTN      Relevant Medications   lisinopril  (ZESTRIL ) 5 MG tablet   Other Relevant Orders   CBC with Differential/Platelet   CMP14+EGFR   Essential hypertension   BP Readings from Last 1 Encounters:  12/04/23 (!) 146/88   Uncontrolled due to noncompliance Restart lisinopril  at 5 mg QD dose Counseled for compliance with the medications Advised DASH diet and moderate exercise/walking, at least 150 mins/week       Relevant Medications   lisinopril  (ZESTRIL ) 5 MG tablet     Respiratory   Mild persistent asthma   Well controlled currently Advised to use Trelegy regularly Albuterol  as needed for dyspnea or wheezing      Relevant Medications   Fluticasone -Umeclidin-Vilant (TRELEGY ELLIPTA IN)   albuterol  (VENTOLIN  HFA) 108 (90 Base) MCG/ACT inhaler   Chronic  sinusitis   Needs to use Flonase  regularly Has a history of nasal polyp, s/p removal by Dr. Karis in the past Has lost follow-up with ENT specialist, referral sent today      Relevant Medications   fluticasone  (FLONASE ) 50 MCG/ACT nasal spray   Other Relevant Orders   Ambulatory referral to ENT   Community acquired pneumonia   Recently treated with Augmentin  in 12/24 Check repeat CXR for confirming resolution      Relevant Medications   Fluticasone -Umeclidin-Vilant (TRELEGY ELLIPTA IN)  albuterol  (VENTOLIN  HFA) 108 (90 Base) MCG/ACT inhaler   fluticasone  (FLONASE ) 50 MCG/ACT nasal spray   Other Relevant Orders   DG Chest 2 View     Endocrine   Hypothyroidism due to Hashimoto's thyroiditis - Primary   Last TSH wnl in 11/24 Continue levothyroxine  112 mcg QD      Relevant Medications   levothyroxine  (SYNTHROID ) 112 MCG tablet   Other Relevant Orders   TSH + free T4     Other   Mixed hyperlipidemia   Last lipid profile reviewed Advised to follow DASH diet for now      Relevant Medications   lisinopril  (ZESTRIL ) 5 MG tablet   Other Relevant Orders   Lipid Profile   Other Visit Diagnoses       Need for hepatitis C screening test       Relevant Orders   Hepatitis C Antibody       Outpatient Encounter Medications as of 12/04/2023  Medication Sig   albuterol  (VENTOLIN  HFA) 108 (90 Base) MCG/ACT inhaler Inhale 2 puffs into the lungs every 6 (six) hours as needed for wheezing or shortness of breath.   Fluticasone -Umeclidin-Vilant (TRELEGY ELLIPTA IN) Inhale into the lungs.   [DISCONTINUED] levothyroxine  (SYNTHROID ) 112 MCG tablet Take 112 mcg by mouth daily.   [DISCONTINUED] lisinopril  (ZESTRIL ) 10 MG tablet TAKE 1 TABLET BY MOUTH EVERY DAY   fluticasone  (FLONASE ) 50 MCG/ACT nasal spray Place 1 spray into both nostrils daily.   levothyroxine  (SYNTHROID ) 112 MCG tablet Take 1 tablet (112 mcg total) by mouth daily.   lisinopril  (ZESTRIL ) 5 MG tablet Take 1 tablet (5 mg  total) by mouth daily.   [DISCONTINUED] fluticasone  (FLONASE ) 50 MCG/ACT nasal spray Place 1 spray into both nostrils daily.   [DISCONTINUED] fluticasone  furoate-vilanterol (BREO ELLIPTA ) 200-25 MCG/INH AEPB Inhale 1 puff into the lungs daily.   [DISCONTINUED] hydrochlorothiazide  (HYDRODIURIL ) 25 MG tablet TAKE 1 TABLET BY MOUTH EVERY DAY   [DISCONTINUED] montelukast  (SINGULAIR ) 10 MG tablet Take 1 tablet (10 mg total) by mouth at bedtime.   [DISCONTINUED] potassium chloride  SA (KLOR-CON  M) 20 MEQ tablet TAKE 1 TABLET BY MOUTH EVERY DAY   [DISCONTINUED] predniSONE  (DELTASONE ) 50 MG tablet Take 1 tablet daily for 5 days   No facility-administered encounter medications on file as of 12/04/2023.    Follow-up: Return in about 4 months (around 04/02/2024) for HTN.   Suzzane MARLA Blanch, MD

## 2023-12-04 NOTE — Assessment & Plan Note (Signed)
 Needs to use Flonase  regularly Has a history of nasal polyp, s/p removal by Dr. Darlin Ehrlich in the past Has lost follow-up with ENT specialist, referral sent today

## 2023-12-04 NOTE — Assessment & Plan Note (Signed)
Last lipid profile reviewed Advised to follow DASH diet for now 

## 2023-12-04 NOTE — Assessment & Plan Note (Signed)
 Likely due to uncontrolled hypothyroidism in the past Appears euvolemic currently Restart lisinopril  for HTN

## 2023-12-04 NOTE — Assessment & Plan Note (Signed)
 Well controlled currently Advised to use Trelegy regularly Albuterol  as needed for dyspnea or wheezing

## 2023-12-04 NOTE — Assessment & Plan Note (Signed)
 BP Readings from Last 1 Encounters:  12/04/23 (!) 146/88   Uncontrolled due to noncompliance Restart lisinopril  at 5 mg QD dose Counseled for compliance with the medications Advised DASH diet and moderate exercise/walking, at least 150 mins/week

## 2024-01-14 ENCOUNTER — Ambulatory Visit (HOSPITAL_COMMUNITY)
Admission: RE | Admit: 2024-01-14 | Discharge: 2024-01-14 | Disposition: A | Discharge status: Home / Self Care | Source: Ambulatory Visit | Attending: Internal Medicine | Admitting: Internal Medicine

## 2024-01-14 DIAGNOSIS — J189 Pneumonia, unspecified organism: Secondary | ICD-10-CM | POA: Insufficient documentation

## 2024-01-27 ENCOUNTER — Encounter: Payer: Self-pay | Admitting: Internal Medicine

## 2024-02-04 ENCOUNTER — Ambulatory Visit (INDEPENDENT_AMBULATORY_CARE_PROVIDER_SITE_OTHER): Payer: 59 | Admitting: Otolaryngology

## 2024-02-04 ENCOUNTER — Other Ambulatory Visit: Payer: Self-pay | Admitting: Internal Medicine

## 2024-02-04 ENCOUNTER — Encounter (INDEPENDENT_AMBULATORY_CARE_PROVIDER_SITE_OTHER): Payer: Self-pay

## 2024-02-04 VITALS — BP 155/95 | HR 99 | Ht 69.0 in | Wt 198.0 lb

## 2024-02-04 DIAGNOSIS — R053 Chronic cough: Secondary | ICD-10-CM

## 2024-02-04 DIAGNOSIS — J339 Nasal polyp, unspecified: Secondary | ICD-10-CM | POA: Diagnosis not present

## 2024-02-04 DIAGNOSIS — J324 Chronic pansinusitis: Secondary | ICD-10-CM

## 2024-02-04 DIAGNOSIS — J3489 Other specified disorders of nose and nasal sinuses: Secondary | ICD-10-CM

## 2024-02-04 DIAGNOSIS — J4530 Mild persistent asthma, uncomplicated: Secondary | ICD-10-CM

## 2024-02-04 DIAGNOSIS — J343 Hypertrophy of nasal turbinates: Secondary | ICD-10-CM

## 2024-02-04 MED ORDER — PREDNISOLONE ACETATE 1 % OP SUSP: 6.0000 [drp] | Freq: Two times a day (BID) | OPHTHALMIC | 5 refills | Status: AC

## 2024-02-04 MED ORDER — AMOXICILLIN-POT CLAVULANATE 875-125 MG PO TABS: 1.0000 | ORAL_TABLET | Freq: Two times a day (BID) | ORAL | 0 refills | Status: AC

## 2024-02-04 MED ORDER — PREDNISONE 10 MG PO TABS: ORAL_TABLET | ORAL | 0 refills | Status: AC

## 2024-02-04 NOTE — Patient Instructions (Addendum)
 I have ordered an imaging study for you to complete prior to your next visit. Please call [facility] Scheduling at [phone] to schedule your imaging if you have not received a call within 24 hours. If you are unable to complete your imaging study prior to your next scheduled visit please call our office to let us know.   Take Prednisone by mouth 30mg  x 4 days (3 pills in morning), then 20mg  x4 days (2 pills), then 10mg  x 4 days (1 pill), then stop. Risks discussed Take Augmentin 875 mg by mouth (PO) twice daily for 7 days; take with food, take probiotic or yogurt with it Use flonase two sprays each nostril twice per day Lloyd Huger Med Nasal Saline Rinse  - put 6 drops of pred forte drops each bottle and then irrigate - start nasal saline rinses with NeilMed Bottle available over the counter    Nasal Saline Irrigation instructions: If you choose to make your own salt water solution, You will need: Salt (kosher, canning, or pickling salt) Baking soda Nasal irrigation bottle (i.e. Lloyd Huger Med Sinus Rinse) Measuring spoon ( teaspoon) Distilled / boiled water  Mix solution Mix 1 teaspoon of salt, 1/2 teaspoon of baking soda and 1 cup of water into irrigation bottle ** May use saline packet instead of homemade recipe for this step if you prefer If medicine was prescribed to be mixed with solution, place this into bottle Examples 2 inches of 2% mupirocin ointment Budesonide solution Position your head: Lean over sink (about 45 degrees) Rotate head (about 45 degrees) so that one nostril is above the other Irrigate Insert tip of irrigation bottle into upper nostril so it forms a comfortable seal Irrigate while breathing through your mouth May remove the straw from the bottle in order to irrigate the entire solution (important if medicine was added) Exhale through nose when finished and blow nose as necessary  Repeat on opposite side with other 1/2 of solution (120 mL) or remake  solution if all 240 mL was used on first side Wash irrigation bottle regularly, replace every 3 months

## 2024-02-04 NOTE — Progress Notes (Signed)
 Dear Dr. Delia Chimes, Here is my assessment for our mutual patient, Eddie Ray. Thank you for allowing me the opportunity to care for your patient. Please do not hesitate to contact me should you have any other questions. Sincerely, Dr. Jovita Kussmaul  Otolaryngology Clinic Note Referring provider: Dr. Delia Chimes HPI:  Eddie Ray is a 48 y.o. male kindly referred by Dr. Allena Katz for evaluation of chronic sinusitis.  Initial visit (01/2024): He reports that has had history of chronic sinusitis for several years, and has had issues with nasal polyposis. He reports that he had surgery in [place] with 2023, and then was on steroids. He did well with that but his symptoms recurred when he's not on steroids. Last time he had steroids was in January when he had CAP and Dx with Augmentin and Steroids. He continues to have problems with nasal congestion, PND, and discolored drainage. Weather makes his symptoms worse - morning is worse, cold symptoms. No fever.  He has used sinus rinses in the past, and flonase. He is not using them currently.   ASA sensitivity: he reports he does not remember  H&N Surgery: FESS(?) in [place] 2023 Personal or FHx of bleeding dz or anesthesia difficulty: no  GLP-1: no AP/AC: no  Tobacco: Chews tobacco  PMHx: HTN, Hypothyroidism, CHF, Asthma  Independent Review of Additional Tests or Records:  CT Maxillofacial w/o 10/11/2021 independently interpreted: noted pansinusitis with nasal polyposis; no obvious skull base dehiscence noted CBC and BMP 12/27/2020: WBC 9.0, Hgb 14.9, Plt 440, Eos 800; BMP showing BUN/Cr 16/1.07 Dr. Trena Platt (12/04/2023): noted CAP in 12/24 after trip to [place]; given Augmentin and prednisone; Asthma evaluated by Pulm; uses chew tobacco; noted chronic sinusitis - h/o nasal polyposis s/p removal ([place]?), lost to follow up  PMH/Meds/All/SocHx/FamHx/ROS:   Past Medical History:  Diagnosis Date   Body mass index (BMI) 29.0-29.9, adult     Hypothyroidism    Hypothyroidism due to Hashimoto's thyroiditis 03/16/2017   Left ventricular diastolic dysfunction    Lower extremity edema    Nausea & vomiting 08/05/2019   Overweight    Severe acute respiratory syndrome    Thyroiditis, lymphocytic      History reviewed. No pertinent surgical history.  Family History  Problem Relation Age of Onset   Diabetes Father    Heart attack Father    Diabetes Sister      Social Connections: Not on file      Current Outpatient Medications:    albuterol (VENTOLIN HFA) 108 (90 Base) MCG/ACT inhaler, Inhale 2 puffs into the lungs every 6 (six) hours as needed for wheezing or shortness of breath., Disp: 18 g, Rfl: 1   amoxicillin-clavulanate (AUGMENTIN) 875-125 MG tablet, Take 1 tablet by mouth 2 (two) times daily for 7 days., Disp: 14 tablet, Rfl: 0   fluticasone (FLONASE) 50 MCG/ACT nasal spray, Place 1 spray into both nostrils daily., Disp: 16 g, Rfl: 5   Fluticasone-Umeclidin-Vilant (TRELEGY ELLIPTA IN), Inhale into the lungs., Disp: , Rfl:    levothyroxine (SYNTHROID) 112 MCG tablet, Take 1 tablet (112 mcg total) by mouth daily., Disp: 90 tablet, Rfl: 1   lisinopril (ZESTRIL) 5 MG tablet, Take 1 tablet (5 mg total) by mouth daily., Disp: 30 tablet, Rfl: 3   prednisoLONE acetate (PRED FORTE) 1 % ophthalmic suspension, Place 6 drops into both eyes in the morning and at bedtime. Put 6 drops In rinse bottle and flush, Disp: 15 mL, Rfl: 5   predniSONE (DELTASONE) 10 MG tablet, Take  3 tablets (30 mg total) by mouth daily with breakfast for 4 days, THEN 2 tablets (20 mg total) daily with breakfast for 4 days, THEN 1 tablet (10 mg total) daily with breakfast for 4 days., Disp: 24 tablet, Rfl: 0   Physical Exam:   BP (!) 155/95 (BP Location: Left Arm, Patient Position: Sitting, Cuff Size: Normal)   Pulse 99   Ht 5\' 9"  (1.753 m)   Wt 198 lb (89.8 kg)   SpO2 94%   BMI 29.24 kg/m   Salient findings:  CN II-XII intact  Bilateral EAC clear and TM  intact with well pneumatized middle ear spaces Anterior rhinoscopy: Septum relatively midline; bilateral inferior turbinates with mild hypertrophy No lesions of oral cavity/oropharynx No respiratory distress or stridor  Seprately Identifiable Procedures:  Prior to initiating any procedures, risks/benefits/alternatives were explained to the patient and verbal consent obtained.  PROCEDURE: Bilateral Diagnostic Rigid Nasal Endoscopy Pre-procedure diagnosis: Concern for chronic sinusitis with nasal polyposis Post-procedure diagnosis: same Indication: See pre-procedure diagnosis and physical exam above Complications: None apparent EBL: 0 mL Anesthesia: Lidocaine 4% and topical decongestant was topically sprayed in each nasal cavity  Description of Procedure:  Patient was identified. A rigid 30 degree endoscope was utilized to evaluate the sinonasal cavities, mucosa, sinus ostia and turbinates and septum.  Overall, signs of mucosal inflammation are noted.  Also noted are diffuse nasal polyps over b/l middle meati and sphenoethmoid recess extending to level of inferior turbinate No mucopurulence noted.    CPT CODE -- 40981 - Mod 25   Impression & Plans:  Eddie Ray is a 48 y.o. male with h/o Asthma  1. Chronic pansinusitis   2. Nasal polyposis   3. Hypertrophy of both inferior nasal turbinates   4. 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 We discussed the nature of CRSwNP and that he will need maintenance treatment. We also discussed his options and will do maximal medical management and refer him to allergy for possible Dupixent given recurrence.  - Start prednisone taper - Start augmentin BID x7d - BID irrigations with Pred-forte 6 drops each rinse bottle - CT Sinus - Ref to Allergy and Imm - Continue Flonase BID and PO antihistamine  See below regarding exact medications prescribed this encounter including dosages and  route: Meds ordered this encounter  Medications   predniSONE (DELTASONE) 10 MG tablet    Sig: Take 3 tablets (30 mg total) by mouth daily with breakfast for 4 days, THEN 2 tablets (20 mg total) daily with breakfast for 4 days, THEN 1 tablet (10 mg total) daily with breakfast for 4 days.    Dispense:  24 tablet    Refill:  0   amoxicillin-clavulanate (AUGMENTIN) 875-125 MG tablet    Sig: Take 1 tablet by mouth 2 (two) times daily for 7 days.    Dispense:  14 tablet    Refill:  0   prednisoLONE acetate (PRED FORTE) 1 % ophthalmic suspension    Sig: Place 6 drops into both eyes in the morning and at bedtime. Put 6 drops In rinse bottle and flush    Dispense:  15 mL    Refill:  5      Thank you for allowing me the opportunity to care for your patient. Please do not hesitate to contact me should you have any other questions.  Sincerely, Jovita Kussmaul, MD Otolaryngologist (ENT), [facility] Phone: [phone] Fax:  [phone]  02/04/2024, 2:54 PM   MDM:  Level 4 - 99204 Complexity/Problems addressed: mod Data complexity: mod - independent review of notes, labs; independent CT interpretation - Morbidity: mod  - Prescription Drug prescribed or managed: yes

## 2024-03-08 ENCOUNTER — Other Ambulatory Visit: Payer: Self-pay | Admitting: Internal Medicine

## 2024-03-08 DIAGNOSIS — I10 Essential (primary) hypertension: Secondary | ICD-10-CM

## 2024-04-06 ENCOUNTER — Encounter: Payer: Self-pay | Admitting: Internal Medicine

## 2024-04-06 ENCOUNTER — Ambulatory Visit: Payer: 59 | Admitting: Internal Medicine

## 2024-04-06 VITALS — BP 152/88 | HR 87 | Ht 69.0 in | Wt 202.8 lb

## 2024-04-06 DIAGNOSIS — R739 Hyperglycemia, unspecified: Secondary | ICD-10-CM

## 2024-04-06 DIAGNOSIS — E063 Autoimmune thyroiditis: Secondary | ICD-10-CM

## 2024-04-06 DIAGNOSIS — I10 Essential (primary) hypertension: Secondary | ICD-10-CM

## 2024-04-06 DIAGNOSIS — J329 Chronic sinusitis, unspecified: Secondary | ICD-10-CM

## 2024-04-06 DIAGNOSIS — E782 Mixed hyperlipidemia: Secondary | ICD-10-CM | POA: Diagnosis not present

## 2024-04-06 DIAGNOSIS — I5032 Chronic diastolic (congestive) heart failure: Secondary | ICD-10-CM

## 2024-04-06 DIAGNOSIS — Z1159 Encounter for screening for other viral diseases: Secondary | ICD-10-CM

## 2024-04-06 MED ORDER — TELMISARTAN 20 MG PO TABS: 20.0000 mg | ORAL_TABLET | Freq: Every day | ORAL | 3 refills | Status: DC

## 2024-04-06 NOTE — Patient Instructions (Signed)
 Please start taking Telmisartan 20 mg once daily instead of Lisinopril .  Please continue to take medications as prescribed.  Please continue to follow low salt diet and perform moderate exercise/walking as tolerated.

## 2024-04-06 NOTE — Assessment & Plan Note (Signed)
 Last TSH wnl in 11/24 Continue levothyroxine  112 mcg QD

## 2024-04-06 NOTE — Progress Notes (Signed)
 Established Patient Office Visit  Subjective:  Patient ID: Eddie Ray, male    DOB: December 29, 1975  Age: 48 y.o. MRN: [id number]  CC:  Chief Complaint  Patient presents with   Medical Management of Chronic Issues    4 month f/u    HPI Eddie Ray is a 48 y.o. male with past medical history of HTN, hypothyroidism, CHF, asthma and obesity who  presents for f/u of his chronic medical conditions.  HTN: His BP was elevated today.  He is on Lisinopril  5 mg once daily currently. He has history of diastolic CHF related pulmonary edema in the past, which was likely provoked due to uncontrolled hypothyroidism.  He currently denies any headache, dizziness, chest pain, dyspnea or palpitations.  He was admitted 07/2019 for SOB in setting of missed synthroid  ( run out of medications due to COVID). Echo showed LVEF of 65-70% and garde 1 DD. Established care with cardiology - Dr. Alroy Aspen 08/2019. Improved dyspnea after synthroid  restarted.   He has history of asthma, has been evaluated by pulmonology.  He has been prescribed Trelegy, but uses it as needed.  He agrees to start using Trelegy regularly and use albuterol  as needed instead.  Denies smoking history.  He does chew tobacco.  He has chronic nasal congestion due to sinusitis and nasal polyposis, has been evaluated by ENT specialist and is planned to get CT of paranasal sinuses.  He is also planned to see allergy specialist.  Past Medical History:  Diagnosis Date   Body mass index (BMI) 29.0-29.9, adult    Hypothyroidism    Hypothyroidism due to Hashimoto's thyroiditis 03/16/2017   Left ventricular diastolic dysfunction    Lower extremity edema    Nausea & vomiting 08/05/2019   Overweight    Severe acute respiratory syndrome    Thyroiditis, lymphocytic     History reviewed. No pertinent surgical history.  Family History  Problem Relation Age of Onset   Diabetes Father    Heart attack Father    Diabetes Sister     Social History    Socioeconomic History   Marital status: Married    Spouse name: Not on file   Number of children: Not on file   Years of education: Not on file   Highest education level: Not on file  Occupational History   Occupation: gas station  attendant  Tobacco Use   Smoking status: Never   Smokeless tobacco: Current  Vaping Use   Vaping status: Never Used  Substance and Sexual Activity   Alcohol use: Yes   Drug use: No   Sexual activity: Not on file    Comment: MARRIED  Other Topics Concern   Not on file  Social History Narrative   Not on file   Social Drivers of Health   Financial Resource Strain: Not on file  Food Insecurity: Not on file  Transportation Needs: Not on file  Physical Activity: Not on file  Stress: Not on file  Social Connections: Not on file  Intimate Partner Violence: Not on file    ROS Review of Systems  Constitutional:  Negative for chills and fever.  HENT:  Positive for congestion, postnasal drip, sinus pressure and sore throat.   Eyes:  Negative for pain and discharge.  Respiratory:  Positive for cough. Negative for shortness of breath.   Cardiovascular:  Negative for chest pain and palpitations.  Gastrointestinal:  Negative for diarrhea, nausea and vomiting.  Endocrine: Negative for polydipsia and polyuria.  Genitourinary:  Negative  for dysuria and hematuria.  Musculoskeletal:  Negative for neck pain and neck stiffness.  Skin:  Negative for rash.  Neurological:  Negative for dizziness and weakness.  Psychiatric/Behavioral:  Negative for agitation and behavioral problems.     Objective:   Today's Vitals: BP (!) 152/88 (BP Location: Left Arm)   Pulse 87   Ht 5' 9 (1.753 m)   Wt 202 lb 12.8 oz (92 kg)   SpO2 95%   BMI 29.95 kg/m   Physical Exam Vitals reviewed.  Constitutional:      General: He is not in acute distress.    Appearance: He is obese. He is not diaphoretic.  HENT:     Head: Normocephalic and atraumatic.     Nose:  Congestion present.     Right Sinus: Frontal sinus tenderness present.     Left Sinus: Frontal sinus tenderness present.     Mouth/Throat:     Mouth: Mucous membranes are moist.  Eyes:     General: No scleral icterus.    Extraocular Movements: Extraocular movements intact.  Cardiovascular:     Rate and Rhythm: Normal rate and regular rhythm.     Heart sounds: Normal heart sounds. No murmur heard. Pulmonary:     Breath sounds: Normal breath sounds. No wheezing or rales.  Musculoskeletal:     Cervical back: Neck supple. No tenderness.     Right lower leg: No edema.     Left lower leg: No edema.  Skin:    General: Skin is warm.     Findings: No rash.  Neurological:     General: No focal deficit present.     Mental Status: He is alert and oriented to person, place, and time.  Psychiatric:        Mood and Affect: Mood normal.        Behavior: Behavior normal.     Assessment & Plan:   Problem List Items Addressed This Visit       Cardiovascular and Mediastinum   Chronic diastolic CHF (congestive heart failure) (HCC)   Likely due to uncontrolled hypothyroidism in the past Appears euvolemic currently Started telmisartan for HTN      Relevant Medications   telmisartan (MICARDIS) 20 MG tablet   Essential hypertension - Primary   BP Readings from Last 1 Encounters:  04/06/24 (!) 152/88   Uncontrolled with lisinopril  at 5 mg QD dose DC lisinopril  due to chronic cough Started telmisartan 20 mg once daily Check CMP after 2 weeks Counseled for compliance with the medications Advised DASH diet and moderate exercise/walking, at least 150 mins/week       Relevant Medications   telmisartan (MICARDIS) 20 MG tablet   Other Relevant Orders   CMP14+EGFR     Respiratory   Chronic sinusitis   Needs to use Flonase  regularly Has a history of nasal polyp, s/p removal by Dr. Darlin Ehrlich in the past Follow-up with ENT specialist - Dr Eddie Aloe        Endocrine   Hypothyroidism due  to Hashimoto's thyroiditis   Last TSH wnl in 11/24 Continue levothyroxine  112 mcg QD      Relevant Orders   TSH + free T4     Other   Mixed hyperlipidemia   Last lipid profile reviewed Advised to follow DASH diet for now      Relevant Medications   telmisartan (MICARDIS) 20 MG tablet   Other Relevant Orders   Lipid Profile   Other Visit Diagnoses  Hyperglycemia       Relevant Orders   Hemoglobin A1c     Need for hepatitis C screening test       Relevant Orders   Hepatitis C Antibody        Outpatient Encounter Medications as of 04/06/2024  Medication Sig   albuterol  (VENTOLIN  HFA) 108 (90 Base) MCG/ACT inhaler Inhale 2 puffs into the lungs every 6 (six) hours as needed for wheezing or shortness of breath.   fluticasone  (FLONASE ) 50 MCG/ACT nasal spray Place 1 spray into both nostrils daily.   Fluticasone -Umeclidin-Vilant (TRELEGY ELLIPTA IN) Inhale into the lungs.   levothyroxine  (SYNTHROID ) 112 MCG tablet Take 1 tablet (112 mcg total) by mouth daily.   prednisoLONE  acetate (PRED FORTE ) 1 % ophthalmic suspension Place 6 drops into both eyes in the morning and at bedtime. Put 6 drops In rinse bottle and flush   telmisartan (MICARDIS) 20 MG tablet Take 1 tablet (20 mg total) by mouth daily.   [DISCONTINUED] lisinopril  (ZESTRIL ) 5 MG tablet TAKE 1 TABLET (5 MG TOTAL) BY MOUTH DAILY.   No facility-administered encounter medications on file as of 04/06/2024.    Follow-up: Return in about 4 months (around 08/06/2024) for HTN.   Meldon Sport, MD

## 2024-04-06 NOTE — Assessment & Plan Note (Signed)
Last lipid profile reviewed Advised to follow DASH diet for now 

## 2024-04-06 NOTE — Assessment & Plan Note (Addendum)
 BP Readings from Last 1 Encounters:  04/06/24 (!) 152/88   Uncontrolled with lisinopril  at 5 mg QD dose DC lisinopril  due to chronic cough Started telmisartan 20 mg once daily Check CMP after 2 weeks Counseled for compliance with the medications Advised DASH diet and moderate exercise/walking, at least 150 mins/week

## 2024-04-06 NOTE — Assessment & Plan Note (Signed)
 Needs to use Flonase  regularly Has a history of nasal polyp, s/p removal by Dr. Darlin Ehrlich in the past Follow-up with ENT specialist - Dr Milon Aloe

## 2024-04-06 NOTE — Assessment & Plan Note (Addendum)
 Likely due to uncontrolled hypothyroidism in the past Appears euvolemic currently Started telmisartan for HTN

## 2024-04-18 ENCOUNTER — Ambulatory Visit: Admitting: Allergy

## 2024-04-18 ENCOUNTER — Other Ambulatory Visit: Payer: Self-pay

## 2024-04-18 ENCOUNTER — Encounter: Payer: Self-pay | Admitting: Allergy

## 2024-04-18 VITALS — BP 134/80 | HR 95 | Temp 98.1°F | Resp 18 | Ht 70.08 in | Wt 200.4 lb

## 2024-04-18 DIAGNOSIS — J339 Nasal polyp, unspecified: Secondary | ICD-10-CM | POA: Diagnosis not present

## 2024-04-18 DIAGNOSIS — J4540 Moderate persistent asthma, uncomplicated: Secondary | ICD-10-CM

## 2024-04-18 DIAGNOSIS — J310 Chronic rhinitis: Secondary | ICD-10-CM | POA: Diagnosis not present

## 2024-04-18 DIAGNOSIS — H1013 Acute atopic conjunctivitis, bilateral: Secondary | ICD-10-CM

## 2024-04-18 DIAGNOSIS — H109 Unspecified conjunctivitis: Secondary | ICD-10-CM

## 2024-04-18 NOTE — Addendum Note (Signed)
 Addended by: JENEL MARYLYNN GRADE on: 04/18/2024 03:17 PM   Modules accepted: Orders

## 2024-04-18 NOTE — Progress Notes (Signed)
 New Patient Note  RE: Eddie Ray MRN: [id number] DOB: July 27, 1976 Date of Office Visit: 04/18/2024  Primary care provider: Blanch Suzzane POUR, MD  Chief Complaint: can't breathe through nose  History of present illness: Eddie Ray is a 48 y.o. male presenting today for evaluation of nasal polyps.  Discussed the use of AI scribe software for clinical note transcription with the patient, who gave verbal consent to proceed.  He has nasal polyps in his sinuses, leading to significant mucus production and nasal congestion, resulting in difficulty breathing through his nose and anosmia. He constantly needs tissues due to mucus and cannot breathe properly. He underwent sinus surgery (FESS) two years ago and states after this he could not breathe and smell but symptoms recurred.  He experiences issues with taste, which he attributes to his loss of smell. He states he can taste spicy foods otherwise doesn't well have good sense of taste either.  He has previously taken prednisone , which improved his nasal symptoms but affected his blood sugar levels, leading to its discontinuation. He states he is not currently doing nasal saline rinses but does plan to start as this was recommended by the ENT. He has a history of asthma and is currently using Trelegy as needed. He also uses a rescue inhaler when experiencing shortness of breath or coughing. His asthma symptoms can be triggered by physical activity, illness, and weather changes.  He has been taking montelukast  for the past two years for allergies, which he finds helpful. He experiences sneezing, throat itchiness, and watery eyes, which he attributes to allergies.  No headaches.  Not currently taking an antihistamine.  On rhinoscopy from 02/04/2024 with Dr. Blanch it is noted that he has diffuse nasal polyps over bilateral middle meatus and sphenoid recess extending to level of inferior turbinate.  He was recommended to do nasal saline rinses  with Pred forte  drops twice a day as well as 6 starting complete prednisone  taper and Augmentin  course.  He was also scheduled for another sinus CT which has not been done yet.  Review of systems: 10pt ROS negative unless noted above in HPI  Past medical history: Past Medical History:  Diagnosis Date   Asthma    Body mass index (BMI) 29.0-29.9, adult    Hypothyroidism    Hypothyroidism due to Hashimoto's thyroiditis 03/16/2017   Left ventricular diastolic dysfunction    Lower extremity edema    Nausea & vomiting 08/05/2019   Overweight    Severe acute respiratory syndrome    Thyroiditis, lymphocytic     Past surgical history: Past Surgical History:  Procedure Laterality Date   SINOSCOPY      Family history:  Family History  Problem Relation Age of Onset   Diabetes Father    Heart attack Father    Diabetes Sister     Social history: Social History   Socioeconomic History   Marital status: Married    Spouse name: Not on file   Number of children: Not on file   Years of education: Not on file   Highest education level: Not on file  Occupational History   Occupation: gas station  attendant  Tobacco Use   Smoking status: Never   Smokeless tobacco: Current  Vaping Use   Vaping status: Never Used  Substance and Sexual Activity   Alcohol use: Yes   Drug use: No   Sexual activity: Not on file    Comment: MARRIED  Other Topics Concern   Not on file  Social History Narrative   Not on file   Social Drivers of Health   Financial Resource Strain: Not on file  Food Insecurity: Not on file  Transportation Needs: Not on file  Physical Activity: Not on file  Stress: Not on file  Social Connections: Not on file  Intimate Partner Violence: Not on file    Medication List: Current Outpatient Medications  Medication Sig Dispense Refill   albuterol  (VENTOLIN  HFA) 108 (90 Base) MCG/ACT inhaler Inhale 2 puffs into the lungs every 6 (six) hours as needed for wheezing or  shortness of breath. 18 g 1   fluticasone  (FLONASE ) 50 MCG/ACT nasal spray Place 1 spray into both nostrils daily. 16 g 5   Fluticasone -Umeclidin-Vilant (TRELEGY ELLIPTA IN) Inhale into the lungs.     levothyroxine  (SYNTHROID ) 112 MCG tablet Take 1 tablet (112 mcg total) by mouth daily. (Patient not taking: Reported on 04/18/2024) 90 tablet 1   prednisoLONE  acetate (PRED FORTE ) 1 % ophthalmic suspension Place 6 drops into both eyes in the morning and at bedtime. Put 6 drops In rinse bottle and flush (Patient not taking: Reported on 04/18/2024) 15 mL 5   telmisartan  (MICARDIS ) 20 MG tablet Take 1 tablet (20 mg total) by mouth daily. (Patient not taking: Reported on 04/18/2024) 30 tablet 3   No current facility-administered medications for this visit.    Known medication allergies: No Known Allergies   Physical examination: Blood pressure 134/80, pulse 95, temperature 98.1 F (36.7 C), temperature source Temporal, resp. rate 18, height 5' 10.08 (1.78 m), weight 200 lb 6.4 oz (90.9 kg), SpO2 93%.  General: Alert, interactive, in no acute distress. HEENT: PERRLA, TMs pearly gray, left nostril with large polyp extending below inf turbinate, rt inf turbinate enlarged; clear mucu b/l; turbinates; post-pharynx non erythematous. Neck: Supple without lymphadenopathy. Lungs: Clear to auscultation without wheezing, rhonchi or rales. {no increased work of breathing. CV: Normal S1, S2 without murmurs. Abdomen: Nondistended, nontender. Skin: Warm and dry, without lesions or rashes. Extremities:  No clubbing, cyanosis or edema. Neuro:   Grossly intact.  Diagnostics/Labs: Imaging: CT Maxillofacial w/o 10/11/2021:  Paranasal sinuses:   Frontal: There is complete opacification of the left frontal sinus and frontoethmoid recess and near-complete opacification of the right frontal sinus and frontoethmoidal recess.   Ethmoid: There is near-complete opacification of the ethmoid air cells bilaterally.    Maxillary: There is complete opacification of the left maxillary sinus and moderate mucosal thickening throughout the right maxillary sinus.   Sphenoid: There is complete opacification of the sphenoid sinuses.   Right ostiomeatal unit: Opacified.   Left ostiomeatal unit: Opacified.   Nasal passages: The nasal passages are largely opacified. A polypoid lesion is seen in the left anterior nasal cavity measuring up to 1.4 cm cc by 1.4 cm AP. Suspected additional polypoid lesions are seen in the right nasal cavity. There is mild rightward nasal septal deviation with a rightward spur.   Anatomy: There is pneumatization superior to both anterior ethmoid notches. Symmetric and intact olfactory grooves and fovea ethmoidalis, Keros II (4-66mm). Sellar sphenoid pneumatization pattern. No dehiscence of carotid or optic canals. No onodi cell.   Other: The globes and orbits are unremarkable. The imaged intracranial compartment is grossly unremarkable. The soft tissues are unremarkable.   IMPRESSION: Pansinusitis as above with near-complete opacification of the sinuses and nasal cavities. There is a polypoid lesion probably arising from the left middle turbinate with probable additional polyps in the bilateral nasal cavities. Recommend correlation with direct visualization.  Spirometry: FEV1: 1.25L 31%, FVC: 2.2L 44% predicted.  Effort is poor as he has no ability to breathe through nose due to polyps.   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 Recurrence post-sinus surgery in 2023. Dupixent recommended for polyp reduction and asthma benefits. Discussed Dupixent side effects, benefits, protocol and mechanism of action today.   - Initiate Dupixent bi-weekly injections. - Coordinate Dupixent prescription and insurance with nurse coordinator Tammy.  She will call you from our [place] office.   - Continue nasal saline rinses and  recommendations from Dr Tobie, your ENT doctor.  Asthma Dupixent should also improve asthma control.  - Instructed on consistent Trelegy use.  Take 1 puff daily.  Rinse mouth after use.  - continue Montelukast  10mg  tab daily. - have access to albuterol  inhaler 2 puffs every 4-6 hours as needed for cough/wheeze/shortness of breath/chest tightness.  May use 15-20 minutes prior to activity.   Monitor frequency of use.   Asthma control goals:  Full participation in all desired activities (may need albuterol  before activity) Albuterol  use two time or less a week on average (not counting use with activity) Cough interfering with sleep two time or less a month Oral steroids no more than once a year No hospitalizations  Rhinoconjunctivitis - have labs done before starting dupixent to assess for environmental allergens (pollen, mold, dust mite, animals, roach) - Montelukast  as above - take antihistamine like Zyrtec, Allegra or Xyzal daily - saline rinses as directed by ENT  Follow-up in 3 months with Dr Arleta Tobie  I appreciate the opportunity to take part in Eddie Ray's care. Please do not hesitate to contact me with questions.  Sincerely,   Danita Brain, MD Allergy/Immunology [facility]

## 2024-04-18 NOTE — Addendum Note (Signed)
 Addended by: JENEL MARYLYNN GRADE on: 04/18/2024 04:16 PM   Modules accepted: Orders

## 2024-04-18 NOTE — Patient Instructions (Addendum)
 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 Recurrence post-sinus surgery in 2023. Dupixent recommended for polyp reduction and asthma benefits. Discussed Dupixent side effects, benefits, protocol and mechanism of action today.   - Initiate Dupixent bi-weekly injections. - Coordinate Dupixent prescription and insurance with nurse coordinator Tammy.  She will call you from our [place] office.   - Continue nasal saline rinses and recommendations from Dr Tobie, your ENT doctor.  Asthma Dupixent should also improve asthma control.  - Instructed on consistent Trelegy use.  Take 1 puff daily.  Rinse mouth after use.  - continue Montelukast  10mg  tab daily. - have access to albuterol  inhaler 2 puffs every 4-6 hours as needed for cough/wheeze/shortness of breath/chest tightness.  May use 15-20 minutes prior to activity.   Monitor frequency of use.   Asthma control goals:  Full participation in all desired activities (may need albuterol  before activity) Albuterol  use two time or less a week on average (not counting use with activity) Cough interfering with sleep two time or less a month Oral steroids no more than once a year No hospitalizations  Allergies - have labs done before starting dupixent to assess for environmental allergens (pollen, mold, dust mite, animals, roach) - Montelukast  as above - take antihistamine like Zyrtec, Allegra or Xyzal daily - saline rinses as directed by ENT  Follow-up in 3 months with Dr Arleta Tobie

## 2024-04-20 ENCOUNTER — Telehealth: Payer: Self-pay | Admitting: *Deleted

## 2024-04-20 NOTE — Telephone Encounter (Signed)
 Called patient and advised approval, copay card and submit to [employer]. Patient wants to get injs in clinic so I will reach out once delivery set to make appt to start Dupixent

## 2024-04-20 NOTE — Telephone Encounter (Signed)
-----   Message from [facility] sent at 04/18/2024  3:03 PM EDT ----- Please start approval for Dupixent for nasal polyps.  Please see ENT note from 410 documenting nasal polyps.  I also documented nasal polyps in my exam.

## 2024-05-19 ENCOUNTER — Ambulatory Visit (INDEPENDENT_AMBULATORY_CARE_PROVIDER_SITE_OTHER): Admitting: Otolaryngology

## 2024-06-03 ENCOUNTER — Ambulatory Visit (INDEPENDENT_AMBULATORY_CARE_PROVIDER_SITE_OTHER)

## 2024-06-03 DIAGNOSIS — J339 Nasal polyp, unspecified: Secondary | ICD-10-CM

## 2024-06-03 MED ORDER — DUPILUMAB 300 MG/2ML ~~LOC~~ SOSY
300.0000 mg | PREFILLED_SYRINGE | SUBCUTANEOUS | Status: AC
  Administered 2024-06-03 – 2024-11-18 (×12): 300 mg via SUBCUTANEOUS

## 2024-06-03 NOTE — Progress Notes (Signed)
 Immunotherapy   Patient Details  Name: Eddie Ray MRN: [id number] Date of Birth: April 04, 1976  06/03/2024  [name] started biologic therpy for dupixwnt  Frequency: Every 14 days Consent signed and patient instructions given. Patient waited in office 15 minutes with no issues.    [name] Shed 06/03/2024, 9:55 AM

## 2024-06-13 ENCOUNTER — Ambulatory Visit

## 2024-06-13 DIAGNOSIS — J339 Nasal polyp, unspecified: Secondary | ICD-10-CM

## 2024-06-28 ENCOUNTER — Ambulatory Visit

## 2024-07-04 ENCOUNTER — Ambulatory Visit (INDEPENDENT_AMBULATORY_CARE_PROVIDER_SITE_OTHER)

## 2024-07-04 DIAGNOSIS — J339 Nasal polyp, unspecified: Secondary | ICD-10-CM

## 2024-07-12 ENCOUNTER — Other Ambulatory Visit: Payer: Self-pay | Admitting: Internal Medicine

## 2024-07-12 DIAGNOSIS — I10 Essential (primary) hypertension: Secondary | ICD-10-CM

## 2024-07-18 ENCOUNTER — Ambulatory Visit (INDEPENDENT_AMBULATORY_CARE_PROVIDER_SITE_OTHER)

## 2024-07-18 DIAGNOSIS — J339 Nasal polyp, unspecified: Secondary | ICD-10-CM

## 2024-07-25 ENCOUNTER — Ambulatory Visit: Admitting: Internal Medicine

## 2024-07-25 ENCOUNTER — Encounter: Payer: Self-pay | Admitting: Internal Medicine

## 2024-07-25 VITALS — BP 130/90 | HR 63 | Temp 97.6°F | Resp 16 | Wt 205.5 lb

## 2024-07-25 DIAGNOSIS — J324 Chronic pansinusitis: Secondary | ICD-10-CM | POA: Diagnosis not present

## 2024-07-25 DIAGNOSIS — J4540 Moderate persistent asthma, uncomplicated: Secondary | ICD-10-CM

## 2024-07-25 DIAGNOSIS — J3089 Other allergic rhinitis: Secondary | ICD-10-CM | POA: Diagnosis not present

## 2024-07-25 DIAGNOSIS — J339 Nasal polyp, unspecified: Secondary | ICD-10-CM

## 2024-07-25 MED ORDER — FLUTICASONE PROPIONATE 50 MCG/ACT NA SUSP: 2.0000 | Freq: Every day | NASAL | 5 refills | Status: AC

## 2024-07-25 MED ORDER — ALBUTEROL SULFATE HFA 108 (90 BASE) MCG/ACT IN AERS: 2.0000 | INHALATION_SPRAY | Freq: Four times a day (QID) | RESPIRATORY_TRACT | 1 refills | Status: AC | PRN

## 2024-07-25 MED ORDER — BREO ELLIPTA 200-25 MCG/ACT IN AEPB: 1.0000 | INHALATION_SPRAY | Freq: Every day | RESPIRATORY_TRACT | 5 refills | Status: AC

## 2024-07-25 NOTE — Patient Instructions (Addendum)
 Moderate Persistent Asthma: - Maintenance inhaler: continue Breo 200-25mcg 1 puff daily.  - Rescue inhaler: Albuterol  2 puffs via spacer or 1 vial via nebulizer every 4-6 hours as needed for respiratory symptoms of cough, shortness of breath, or wheezing Asthma control goals:  Full participation in all desired activities (may need albuterol  before activity) Albuterol  use two times or less a week on average (not counting use with activity) Cough interfering with sleep two times or less a month Oral steroids no more than once a year No hospitalizations   Chronic Sinusitis with Nasal Polyps Other Allergic Rhinitis  - Use nasal saline rinses before nose sprays such as with Neilmed Sinus Rinse.  Use distilled water.   - Use Flonase  (Fluticasone ) 2 sprays each nostril daily. Aim upward and outward. - Use Dupixent  300mg  subcutaneous every other week.   Hold all anti-histamines (Xyzal, Allegra, Zyrtec, Claritin, Benadryl, Pepcid) 3 days prior to next visit.   Follow up: 10/6 230 PM for skin testing 1-55

## 2024-07-25 NOTE — Progress Notes (Signed)
 FOLLOW UP Date of Service/Encounter:  07/25/24   Subjective:  Eddie Ray (DOB: 1976/07/11) is a 48 y.o. male who [facility] for follow up for asthma and CRS with nasal polyps.   History obtained from: chart review and patient. Last visit was with Dr Jeneal 6/23/205 and at the time, discussed starting Dupixent  for nasal polyps/asthma.    Since last visit, reports asthma has been doing well.  Does not much trouble with wheezing/cough/dyspnea.  Has not needed Albuterol  at all.  Taking Breo almost daily but sometimes forgets. No ER visits/urgent care visits/oral prednisone  use for asthma since last visit.    Reports nose is doing a lot better too with decreased congestion, drainage and some improvement in sense of smell.  Started Dupixent  06/03/24, doing well on this and happy with results.  No issues with the shot.  Generally symptoms are worse during Spring/Fall.   Past Medical History: Past Medical History:  Diagnosis Date   Asthma    Body mass index (BMI) 29.0-29.9, adult    Hypothyroidism    Hypothyroidism due to Hashimoto's thyroiditis 03/16/2017   Left ventricular diastolic dysfunction    Lower extremity edema    Nausea & vomiting 08/05/2019   Overweight    Severe acute respiratory syndrome    Thyroiditis, lymphocytic     Objective:  BP (!) 130/90   Pulse 63   Temp 97.6 F (36.4 C)   Resp 16   Wt 205 lb 8 oz (93.2 kg)   SpO2 98%   BMI 29.42 kg/m  Body mass index is 29.42 kg/m. Physical Exam: GEN: alert, well developed HEENT: clear conjunctiva, nose with mild inferior turbinate hypertrophy, pink nasal mucosa, + clear rhinorrhea, + cobblestoning HEART: regular rate and rhythm, no murmur LUNGS: clear to auscultation bilaterally, no coughing, unlabored respiration SKIN: no rashes or lesions  Spirometry:  Tracings reviewed. His effort: It was hard to get consistent efforts and there is a question as to whether this  reflects a maximal maneuver. FVC: 3.05L, 65% predicted  FEV1: 2.52L, 67% predicted FEV1/FVC ratio: 83% Interpretation: Spirometry consistent with possible restrictive disease.  Please see scanned spirometry results for details.  Assessment:   1. Other allergic rhinitis   2. Moderate persistent asthma, uncomplicated   3. Chronic pansinusitis   4. Nasal polyps     Plan/Recommendations:  Moderate Persistent Asthma: - Controlled, spirometry today without obstruction, possible restriction related to suboptimal effort.  - Maintenance inhaler: continue Breo 200-25mcg 1 puff daily.  - Rescue inhaler: Albuterol  2 puffs via spacer or 1 vial via nebulizer every 4-6 hours as needed for respiratory symptoms of cough, shortness of breath, or wheezing Asthma control goals:  Full participation in all desired activities (may need albuterol  before activity) Albuterol  use two times or less a week on average (not counting use with activity) Cough interfering with sleep two times or less a month Oral steroids no more than once a year No hospitalizations   Chronic Sinusitis with Nasal Polyps Other Allergic Rhinitis  - Due to turbinate hypertrophy, chronic polyps, asthma, seasonal symptoms, will plan to allergy test at next visit.  - Use nasal saline rinses before nose sprays such as with Neilmed Sinus Rinse.  Use distilled water.   - Use Flonase  (Fluticasone ) 2 sprays each nostril daily. Aim upward and outward. - Use Dupixent  300mg  subcutaneous every other week.   Hold all anti-histamines (Xyzal, Allegra, Zyrtec, Claritin, Benadryl, Pepcid) 3 days prior to next  visit.   Follow up: 10/6 230 PM for skin testing 1-55, no IDs    Arleta Blanch, MD [facility] 

## 2024-07-25 NOTE — Addendum Note (Signed)
 Addended by: JENEL MARYLYNN GRADE on: 07/25/2024 05:03 PM   Modules accepted: Orders

## 2024-08-01 ENCOUNTER — Ambulatory Visit

## 2024-08-01 ENCOUNTER — Ambulatory Visit (INDEPENDENT_AMBULATORY_CARE_PROVIDER_SITE_OTHER): Admitting: Internal Medicine

## 2024-08-01 DIAGNOSIS — J339 Nasal polyp, unspecified: Secondary | ICD-10-CM

## 2024-08-01 DIAGNOSIS — J3089 Other allergic rhinitis: Secondary | ICD-10-CM | POA: Diagnosis not present

## 2024-08-01 DIAGNOSIS — J4540 Moderate persistent asthma, uncomplicated: Secondary | ICD-10-CM | POA: Diagnosis not present

## 2024-08-01 DIAGNOSIS — J301 Allergic rhinitis due to pollen: Secondary | ICD-10-CM

## 2024-08-01 MED ORDER — CETIRIZINE HCL 10 MG PO TABS: 10.0000 mg | ORAL_TABLET | Freq: Every day | ORAL | 5 refills | Status: AC

## 2024-08-01 NOTE — Patient Instructions (Addendum)
 Moderate Persistent Asthma: - Maintenance inhaler: continue Breo 200-25mcg 1 puff daily.  - Rescue inhaler: Albuterol  2 puffs via spacer or 1 vial via nebulizer every 4-6 hours as needed for respiratory symptoms of cough, shortness of breath, or wheezing Asthma control goals:  Full participation in all desired activities (may need albuterol  before activity) Albuterol  use two times or less a week on average (not counting use with activity) Cough interfering with sleep two times or less a month Oral steroids no more than once a year No hospitalizations   Chronic Sinusitis with Nasal Polyps Allergic Rhinitis  - SPT 07/2024: positive to trees, grasses, weeds, molds, dust mites, cockroach  - Use nasal saline rinses before nose sprays such as with Neilmed Sinus Rinse.  Use distilled water.   - Use Flonase  (Fluticasone ) 2 sprays each nostril daily. Aim upward and outward. - Use Dupixent  300mg  subcutaneous every other week. - Use Zyrtec (Cetirizine)10 mg daily. - Consider allergy shots as long term control of your symptoms by teaching your immune system to be more tolerant of your allergy triggers   ALLERGEN AVOIDANCE MEASURES   Dust Mites Use central air conditioning and heat; and change the filter monthly.  Pleated filters work better than mesh filters.  Electrostatic filters may also be used; wash the filter monthly.  Window air conditioners may be used, but do not clean the air as well as a central air conditioner.  Change or wash the filter monthly. Keep windows closed.  Do not use attic fans.   Encase the mattress, box springs and pillows with zippered, dust proof covers. Wash the bed linens in hot water weekly.   Remove carpet, especially from the bedroom. Remove stuffed animals, throw pillows, dust ruffles, heavy drapes and other items that collect dust from the bedroom. Do not use a humidifier.   Use wood, vinyl or leather furniture instead of cloth furniture in the bedroom. Keep  the indoor humidity at 30 - 40%.   Molds - Indoor avoidance Use air conditioning to reduce indoor humidity.  Do not use a humidifier. Keep indoor humidity at 30 - 40%.  Use a dehumidifier if needed. In the bathroom use an exhaust fan or open a window after showering.  Wipe down damp surfaces after showering.  Clean bathrooms with a mold-killing solution (diluted bleach, or products like Tilex, etc) at least once a month. In the kitchen use an exhaust fan to remove steam from cooking.  Throw away spoiled foods immediately, and empty garbage daily.  Empty water pans below self-defrosting refrigerators frequently. Vent the clothes dryer to the outside. Limit indoor houseplants; mold grows in the dirt.  No houseplants in the bedroom. Remove carpet from the bedroom. Encase the mattress and box springs with a zippered encasing.  Molds - Outdoor avoidance Avoid being outside when the grass is being mowed, or the ground is tilled. Avoid playing in leaves, pine straw, hay, etc.  Dead plant materials contain mold. Avoid going into barns or grain storage areas. Remove leaves, clippings and compost from around the home.  Cockroach Limit spread of food around the house; especially keep food out of bedrooms. Keep food and garbage in closed containers with a tight lid.  Never leave food out in the kitchen.  Do not leave out pet food or dirty food bowls. Mop the kitchen floor and wash countertops at least once a week. Repair leaky pipes and faucets so there is no standing water to attract roaches. Plug up cracks in the  house through which cockroaches can enter. Use bait stations and approved pesticides to reduce cockroach infestation. Pollen Avoidance Pollen levels are highest during the mid-day and afternoon.  Consider this when planning outdoor activities. Avoid being outside when the grass is being mowed, or wear a mask if the pollen-allergic person must be the one to mow the grass. Keep the windows  closed to keep pollen outside of the home. Use an air conditioner to filter the air. Take a shower, wash hair, and change clothing after working or playing outdoors during pollen season.

## 2024-08-01 NOTE — Progress Notes (Signed)
 FOLLOW UP Date of Service/Encounter:  08/01/24   Subjective:  Eddie Ray (DOB: Jan 07, 1976) is a 48 y.o. male who [facility] for follow up for skin testing.   History obtained from: chart review and patient.  Anti histamines held.   Past Medical History: Past Medical History:  Diagnosis Date   Asthma    Body mass index (BMI) 29.0-29.9, adult    Hypothyroidism    Hypothyroidism due to Hashimoto's thyroiditis 03/16/2017   Left ventricular diastolic dysfunction    Lower extremity edema    Nausea & vomiting 08/05/2019   Overweight    Severe acute respiratory syndrome    Thyroiditis, lymphocytic     Objective:  There were no vitals taken for this visit. There is no height or weight on file to calculate BMI. Physical Exam: GEN: alert, well developed HEENT: clear conjunctiva, MMM LUNGS: unlabored respiration  Skin Testing:  Skin prick testing was placed, which includes aeroallergens/foods, histamine control, and saline control.  Verbal consent was obtained prior to placing test.  Patient tolerated procedure well.  Allergy testing results were read and interpreted by myself, documented by clinical staff. Adequate positive and negative control.  Positive results to:  Results discussed with patient/family.  Airborne Adult Perc - 08/01/24 1425     Time Antigen Placed 1425    Allergen Manufacturer Jestine    Location Back    Number of Test 55    1. Control-Buffer 50% Glycerol Negative    2. Control-Histamine 3+    3. Bahia Negative    4. [place] 2+    5. Johnson 2+    6. [place]  Blue Negative    7. Meadow Fescue 2+    8. Perennial Rye Negative    9. Timothy 2+    10. Ragweed Mix Negative    11. Cocklebur Negative    12. Plantain,  English Negative    13. Baccharis 2+    14. Dog Fennel 2+    15. Russian Thistle 3+    16. Lamb's Quarters 3+    17. Sheep Sorrell Negative    18. Rough Pigweed Negative    19. Marsh  Elder, Rough 3+    20. Mugwort, Common 3+    21. Box, Elder 3+    22. Cedar, red 3+    23. Sweet Gum 2+    24. Pecan Pollen 2+    25. Pine Mix Negative    26. Walnut, Black Pollen Negative    27. Red Mulberry Negative    28. Ash Mix 2+    29. Birch Mix 2+    30. Beech American Negative    31. Cottonwood, [place] Negative    32. Hickory, White 2+    33. Maple Mix 3+    34. Oak, [place] Mix Negative    35. Sycamore Eastern Negative    36. Alternaria Alternata 3+    37. Cladosporium Herbarum 3+    38. Aspergillus Mix 3+    39. Penicillium Mix 2+    40. Bipolaris Sorokiniana (Helminthosporium) 2+    41. Drechslera Spicifera (Curvularia) Negative    42. Mucor Plumbeus Negative    43. Fusarium Moniliforme Negative    44. Aureobasidium Pullulans (pullulara) Negative    45. Rhizopus Oryzae Negative    46. Botrytis Cinera 2+    47. Epicoccum Nigrum 3+    48. Phoma Betae Negative    49. Dust Mite Mix 3+    50.  Cat Hair 10,000 BAU/ml Negative    51.  Dog Epithelia Negative    52. Mixed Feathers Negative    53. Horse Epithelia Negative    54. Cockroach, German 3+    55. Tobacco Leaf Negative           Assessment:   1. Seasonal allergic rhinitis due to pollen   2. Moderate persistent asthma, uncomplicated   3. Allergic rhinitis caused by mold   4. Allergic rhinitis due to dust mite   5. Allergic rhinitis due to insect   6. Nasal polyps     Plan/Recommendations:  Chronic Sinusitis with Nasal Polyps Allergic Rhinitis  - Due to turbinate hypertrophy, chronic polyps, asthma, seasonal symptoms, will plan to allergy test. - SPT 07/2024: positive to trees, grasses, weeds, molds, dust mites, cockroach  - Avoidance measures discussed.   - Use nasal saline rinses before nose sprays such as with Neilmed Sinus Rinse.  Use distilled water.   - Use Flonase  (Fluticasone ) 2 sprays each nostril daily. Aim upward and outward. - Use Dupixent  300mg  subcutaneous every other week. - Use  Zyrtec (Cetirizine)10 mg daily. - Consider allergy shots as long term control of your symptoms by teaching your immune system to be more tolerant of your allergy triggers  Moderate Persistent Asthma: - Maintenance inhaler: continue Breo 200-25mcg 1 puff daily.  - Rescue inhaler: Albuterol  2 puffs via spacer or 1 vial via nebulizer every 4-6 hours as needed for respiratory symptoms of cough, shortness of breath, or wheezing Asthma control goals:  Full participation in all desired activities (may need albuterol  before activity) Albuterol  use two times or less a week on average (not counting use with activity) Cough interfering with sleep two times or less a month Oral steroids no more than once a year No hospitalizations     ALLERGEN AVOIDANCE MEASURES   Dust Mites Use central air conditioning and heat; and change the filter monthly.  Pleated filters work better than mesh filters.  Electrostatic filters may also be used; wash the filter monthly.  Window air conditioners may be used, but do not clean the air as well as a central air conditioner.  Change or wash the filter monthly. Keep windows closed.  Do not use attic fans.   Encase the mattress, box springs and pillows with zippered, dust proof covers. Wash the bed linens in hot water weekly.   Remove carpet, especially from the bedroom. Remove stuffed animals, throw pillows, dust ruffles, heavy drapes and other items that collect dust from the bedroom. Do not use a humidifier.   Use wood, vinyl or leather furniture instead of cloth furniture in the bedroom. Keep the indoor humidity at 30 - 40%.   Molds - Indoor avoidance Use air conditioning to reduce indoor humidity.  Do not use a humidifier. Keep indoor humidity at 30 - 40%.  Use a dehumidifier if needed. In the bathroom use an exhaust fan or open a window after showering.  Wipe down damp surfaces after showering.  Clean bathrooms with a mold-killing solution (diluted bleach, or  products like Tilex, etc) at least once a month. In the kitchen use an exhaust fan to remove steam from cooking.  Throw away spoiled foods immediately, and empty garbage daily.  Empty water pans below self-defrosting refrigerators frequently. Vent the clothes dryer to the outside. Limit indoor houseplants; mold grows in the dirt.  No houseplants in the bedroom. Remove carpet from the bedroom. Encase the mattress and box springs with a zippered  encasing.  Molds - Outdoor avoidance Avoid being outside when the grass is being mowed, or the ground is tilled. Avoid playing in leaves, pine straw, hay, etc.  Dead plant materials contain mold. Avoid going into barns or grain storage areas. Remove leaves, clippings and compost from around the home.  Cockroach Limit spread of food around the house; especially keep food out of bedrooms. Keep food and garbage in closed containers with a tight lid.  Never leave food out in the kitchen.  Do not leave out pet food or dirty food bowls. Mop the kitchen floor and wash countertops at least once a week. Repair leaky pipes and faucets so there is no standing water to attract roaches. Plug up cracks in the house through which cockroaches can enter. Use bait stations and approved pesticides to reduce cockroach infestation. Pollen Avoidance Pollen levels are highest during the mid-day and afternoon.  Consider this when planning outdoor activities. Avoid being outside when the grass is being mowed, or wear a mask if the pollen-allergic person must be the one to mow the grass. Keep the windows closed to keep pollen outside of the home. Use an air conditioner to filter the air. Take a shower, wash hair, and change clothing after working or playing outdoors during pollen season.       Return in about 6 months (around 01/30/2025).  Arleta Blanch, MD [facility] 

## 2024-08-08 ENCOUNTER — Encounter: Payer: Self-pay | Admitting: Internal Medicine

## 2024-08-08 ENCOUNTER — Ambulatory Visit: Admitting: Internal Medicine

## 2024-08-08 VITALS — BP 138/86 | HR 82 | Ht 69.0 in | Wt 209.8 lb

## 2024-08-08 DIAGNOSIS — I10 Essential (primary) hypertension: Secondary | ICD-10-CM | POA: Diagnosis not present

## 2024-08-08 DIAGNOSIS — E063 Autoimmune thyroiditis: Secondary | ICD-10-CM

## 2024-08-08 DIAGNOSIS — Z23 Encounter for immunization: Secondary | ICD-10-CM

## 2024-08-08 DIAGNOSIS — Z0001 Encounter for general adult medical examination with abnormal findings: Secondary | ICD-10-CM | POA: Diagnosis not present

## 2024-08-08 DIAGNOSIS — J4540 Moderate persistent asthma, uncomplicated: Secondary | ICD-10-CM | POA: Diagnosis not present

## 2024-08-08 DIAGNOSIS — Z1211 Encounter for screening for malignant neoplasm of colon: Secondary | ICD-10-CM

## 2024-08-08 DIAGNOSIS — E782 Mixed hyperlipidemia: Secondary | ICD-10-CM

## 2024-08-08 NOTE — Patient Instructions (Signed)
Please continue to take medications as prescribed.  Please continue to follow low salt diet and perform moderate exercise/walking at least 150 mins/week. 

## 2024-08-08 NOTE — Progress Notes (Signed)
 Established Patient Office Visit  Subjective:  Patient ID: Eddie Ray, male    DOB: [birth year]  Age: 48 y.o. MRN: [id number]  CC:  Chief Complaint  Patient presents with   Hypertension    4 month f/u     HPI Eddie Ray is a 48 y.o. male with past medical history of HTN, hypothyroidism, CHF, asthma and obesity who  presents for f/u of his chronic medical conditions.  HTN: His BP was wnl today.  He is on telmisartan  20 mg once daily currently. He has history of diastolic CHF related pulmonary edema in the past, which was likely provoked due to uncontrolled hypothyroidism.  He currently denies any headache, dizziness, chest pain, dyspnea or palpitations.  He was admitted 07/2019 for SOB in setting of missed synthroid  ( run out of medications due to COVID). Echo showed LVEF of 65-70% and garde 1 DD. Established care with cardiology - Dr. Alveta 08/2019. Improved dyspnea after synthroid  restarted.   He has history of asthma, has been evaluated by pulmonology.  He has Breo as maintenance inhaler now.  He has as needed albuterol  for dyspnea or wheezing.  Denies smoking history.  He does chew tobacco.  He has chronic nasal congestion due to sinusitis and nasal polyposis, has been evaluated by ENT specialist.  He has seen allergy specialist and is on Dupixent  now, which has improved his symptoms significantly.  Past Medical History:  Diagnosis Date   Asthma    Body mass index (BMI) 29.0-29.9, adult    Hypothyroidism    Hypothyroidism due to Hashimoto's thyroiditis 03/16/2017   Left ventricular diastolic dysfunction    Lower extremity edema    Nausea & vomiting 08/05/2019   Overweight    Severe acute respiratory syndrome    Thyroiditis, lymphocytic     Past Surgical History:  Procedure Laterality Date   SINOSCOPY      Family History  Problem Relation Age of Onset   Diabetes Father    Heart attack Father    Diabetes Sister     Social History   Socioeconomic History    Marital status: Married    Spouse name: Not on file   Number of children: Not on file   Years of education: Not on file   Highest education level: Not on file  Occupational History   Occupation: gas station  attendant  Tobacco Use   Smoking status: Never   Smokeless tobacco: Current    Types: Chew  Vaping Use   Vaping status: Never Used  Substance and Sexual Activity   Alcohol use: Not Currently   Drug use: No   Sexual activity: Not on file    Comment: MARRIED  Other Topics Concern   Not on file  Social History Narrative   Not on file   Social Drivers of Health   Financial Resource Strain: Not on file  Food Insecurity: Not on file  Transportation Needs: Not on file  Physical Activity: Not on file  Stress: Not on file  Social Connections: Not on file  Intimate Partner Violence: Not on file    ROS Review of Systems  Constitutional:  Negative for chills and fever.  HENT:  Positive for congestion, postnasal drip, sinus pressure and sore throat.   Eyes:  Negative for pain and discharge.  Respiratory:  Positive for cough. Negative for shortness of breath.   Cardiovascular:  Negative for chest pain and palpitations.  Gastrointestinal:  Negative for diarrhea, nausea and vomiting.  Endocrine: Negative for  polydipsia and polyuria.  Genitourinary:  Negative for dysuria and hematuria.  Musculoskeletal:  Negative for neck pain and neck stiffness.  Skin:  Negative for rash.  Neurological:  Negative for dizziness and weakness.  Psychiatric/Behavioral:  Negative for agitation and behavioral problems.     Objective:   Today's Vitals: BP 138/86   Pulse 82   Ht 5' 9 (1.753 m)   Wt 209 lb 12.8 oz (95.2 kg)   SpO2 98%   BMI 30.98 kg/m   Physical Exam Vitals reviewed.  Constitutional:      General: He is not in acute distress.    Appearance: He is obese. He is not diaphoretic.  HENT:     Head: Normocephalic and atraumatic.     Nose: Congestion present.     Right  Sinus: Frontal sinus tenderness present.     Left Sinus: Frontal sinus tenderness present.     Mouth/Throat:     Mouth: Mucous membranes are moist.  Eyes:     General: No scleral icterus.    Extraocular Movements: Extraocular movements intact.  Cardiovascular:     Rate and Rhythm: Normal rate and regular rhythm.     Heart sounds: Normal heart sounds. No murmur heard. Pulmonary:     Breath sounds: Normal breath sounds. No wheezing or rales.  Musculoskeletal:     Cervical back: Neck supple. No tenderness.     Right lower leg: No edema.     Left lower leg: No edema.  Skin:    General: Skin is warm.     Findings: No rash.  Neurological:     General: No focal deficit present.     Mental Status: He is alert and oriented to person, place, and time.  Psychiatric:        Mood and Affect: Mood normal.        Behavior: Behavior normal.     Assessment & Plan:   Problem List Items Addressed This Visit       Cardiovascular and Mediastinum   Essential hypertension   BP Readings from Last 1 Encounters:  08/08/24 138/86   Well-controlled with telmisartan  20 mg QD Counseled for compliance with the medications Advised DASH diet and moderate exercise/walking, at least 150 mins/week        Respiratory   Moderate persistent asthma   Well controlled currently On Dupixent  now Has Breo maintenance inhaler Albuterol  as needed for dyspnea or wheezing Followed by Allergy and Immunology clinic        Endocrine   Hypothyroidism due to Hashimoto's thyroiditis   Last TSH wnl in 11/24 Needs to get updated blood tests Continue levothyroxine  112 mcg QD        Other   Mixed hyperlipidemia   Last lipid profile reviewed Advised to follow DASH diet for now      Colon cancer screening   Discussed about colonoscopy and cologuard - benefits of each procedure discussed. Patient prefers Cologuard - ordered.      Relevant Orders   Cologuard   Encounter for general adult medical  examination with abnormal findings - Primary   Physical exam as documented. Fasting blood tests ordered today. Tdap vaccine today. Denies flu vaccine.      Other Visit Diagnoses       Encounter for immunization       Relevant Orders   Tdap vaccine greater than or equal to 7yo IM (Completed)        Outpatient Encounter Medications as of 08/08/2024  Medication  Sig   albuterol  (VENTOLIN  HFA) 108 (90 Base) MCG/ACT inhaler Inhale 2 puffs into the lungs every 6 (six) hours as needed for wheezing or shortness of breath.   BREO ELLIPTA  200-25 MCG/ACT AEPB Inhale 1 puff into the lungs daily.   cetirizine (ZYRTEC ALLERGY) 10 MG tablet Take 1 tablet (10 mg total) by mouth daily.   fluticasone  (FLONASE ) 50 MCG/ACT nasal spray Place 2 sprays into both nostrils daily.   levothyroxine  (SYNTHROID ) 112 MCG tablet Take 1 tablet (112 mcg total) by mouth daily.   prednisoLONE  acetate (PRED FORTE ) 1 % ophthalmic suspension Place 6 drops into both eyes in the morning and at bedtime. Put 6 drops In rinse bottle and flush   telmisartan  (MICARDIS ) 20 MG tablet TAKE 1 TABLET BY MOUTH EVERY DAY   Facility-Administered Encounter Medications as of 08/08/2024  Medication   dupilumab  (DUPIXENT ) prefilled syringe 300 mg    Follow-up: Return in about 6 months (around 02/06/2025) for HTN and hypothyroidism.   Suzzane MARLA Blanch, MD

## 2024-08-14 DIAGNOSIS — Z1211 Encounter for screening for malignant neoplasm of colon: Secondary | ICD-10-CM | POA: Insufficient documentation

## 2024-08-14 DIAGNOSIS — Z0001 Encounter for general adult medical examination with abnormal findings: Secondary | ICD-10-CM | POA: Insufficient documentation

## 2024-08-14 NOTE — Assessment & Plan Note (Signed)
 Well controlled currently On Dupixent  now Has Breo maintenance inhaler Albuterol  as needed for dyspnea or wheezing Followed by Allergy and Immunology clinic

## 2024-08-14 NOTE — Assessment & Plan Note (Signed)
 BP Readings from Last 1 Encounters:  08/08/24 138/86   Well-controlled with telmisartan  20 mg QD Counseled for compliance with the medications Advised DASH diet and moderate exercise/walking, at least 150 mins/week

## 2024-08-14 NOTE — Assessment & Plan Note (Signed)
 Physical exam as documented. Fasting blood tests ordered today. Tdap vaccine today. Denies flu vaccine.

## 2024-08-14 NOTE — Assessment & Plan Note (Signed)
 Last TSH wnl in 11/24 Needs to get updated blood tests Continue levothyroxine  112 mcg QD

## 2024-08-14 NOTE — Assessment & Plan Note (Signed)
Last lipid profile reviewed Advised to follow DASH diet for now 

## 2024-08-14 NOTE — Assessment & Plan Note (Signed)
 Discussed about colonoscopy and cologuard - benefits of each procedure discussed. Patient prefers Cologuard - ordered.

## 2024-08-17 ENCOUNTER — Ambulatory Visit (INDEPENDENT_AMBULATORY_CARE_PROVIDER_SITE_OTHER)

## 2024-08-17 DIAGNOSIS — J339 Nasal polyp, unspecified: Secondary | ICD-10-CM

## 2024-08-31 ENCOUNTER — Ambulatory Visit

## 2024-08-31 DIAGNOSIS — J339 Nasal polyp, unspecified: Secondary | ICD-10-CM

## 2024-09-16 ENCOUNTER — Ambulatory Visit (INDEPENDENT_AMBULATORY_CARE_PROVIDER_SITE_OTHER)

## 2024-09-16 DIAGNOSIS — J339 Nasal polyp, unspecified: Secondary | ICD-10-CM | POA: Diagnosis not present

## 2024-09-22 ENCOUNTER — Ambulatory Visit: Payer: Self-pay | Admitting: Internal Medicine

## 2024-09-22 LAB — COLOGUARD: COLOGUARD: NEGATIVE

## 2024-10-03 ENCOUNTER — Ambulatory Visit

## 2024-10-07 ENCOUNTER — Ambulatory Visit

## 2024-10-07 DIAGNOSIS — J339 Nasal polyp, unspecified: Secondary | ICD-10-CM | POA: Diagnosis not present

## 2024-10-24 ENCOUNTER — Ambulatory Visit

## 2024-10-24 DIAGNOSIS — J339 Nasal polyp, unspecified: Secondary | ICD-10-CM | POA: Diagnosis not present

## 2024-11-04 ENCOUNTER — Other Ambulatory Visit: Payer: Self-pay | Admitting: Internal Medicine

## 2024-11-04 DIAGNOSIS — E063 Autoimmune thyroiditis: Secondary | ICD-10-CM

## 2024-11-07 ENCOUNTER — Ambulatory Visit

## 2024-11-07 DIAGNOSIS — J339 Nasal polyp, unspecified: Secondary | ICD-10-CM | POA: Diagnosis not present

## 2024-11-18 ENCOUNTER — Ambulatory Visit

## 2024-11-18 DIAGNOSIS — J339 Nasal polyp, unspecified: Secondary | ICD-10-CM

## 2024-11-21 ENCOUNTER — Ambulatory Visit

## 2024-12-05 ENCOUNTER — Ambulatory Visit

## 2025-01-30 ENCOUNTER — Ambulatory Visit: Admitting: Family Medicine

## 2025-02-06 ENCOUNTER — Ambulatory Visit: Admitting: Internal Medicine
